# Patient Record
Sex: Female | Born: 1985 | Race: White | Hispanic: No | Marital: Married | Smoking: Never smoker
Health system: Southern US, Community
[De-identification: ages and names within clinical notes are randomized; demographics above are authoritative.]

## PROBLEM LIST (undated history)

## (undated) DIAGNOSIS — K219 Gastro-esophageal reflux disease without esophagitis: Secondary | ICD-10-CM

## (undated) DIAGNOSIS — K5190 Ulcerative colitis, unspecified, without complications: Secondary | ICD-10-CM

## (undated) DIAGNOSIS — F419 Anxiety disorder, unspecified: Secondary | ICD-10-CM

## (undated) DIAGNOSIS — Z789 Other specified health status: Secondary | ICD-10-CM

## (undated) HISTORY — PX: WISDOM TOOTH EXTRACTION: SHX21

## (undated) HISTORY — PX: COLONOSCOPY: SHX174

---

## 2016-06-25 DIAGNOSIS — Z124 Encounter for screening for malignant neoplasm of cervix: Secondary | ICD-10-CM | POA: Diagnosis not present

## 2016-06-25 DIAGNOSIS — Z01419 Encounter for gynecological examination (general) (routine) without abnormal findings: Secondary | ICD-10-CM | POA: Diagnosis not present

## 2016-12-15 DIAGNOSIS — N7689 Other specified inflammation of vagina and vulva: Secondary | ICD-10-CM | POA: Diagnosis not present

## 2017-02-18 DIAGNOSIS — N930 Postcoital and contact bleeding: Secondary | ICD-10-CM | POA: Diagnosis not present

## 2017-03-25 NOTE — L&D Delivery Note (Signed)
Patient was C/C/+2 and pushed for 30 minutes with epidural.    NSVD female infant, Apgars 9/9, weight pending.  Shoulder dystocia relieved in approx 10sec with McRoberts and suprapubic pressure. The patient had right periurethral, right vaginal and 2nd degree laceration repaired with 2-0 and 3-0 vicryl. Fundus was firm. EBL was expected amount. Placenta was delivered intact. Vagina was clear.  Delayed cord clamping done for 30-60 seconds while warming baby. Baby was vigorous and doing skin to skin with mother.  Cynthia Campbell

## 2017-04-14 DIAGNOSIS — Z3201 Encounter for pregnancy test, result positive: Secondary | ICD-10-CM | POA: Diagnosis not present

## 2017-04-14 DIAGNOSIS — Z113 Encounter for screening for infections with a predominantly sexual mode of transmission: Secondary | ICD-10-CM | POA: Diagnosis not present

## 2017-04-14 DIAGNOSIS — Z3491 Encounter for supervision of normal pregnancy, unspecified, first trimester: Secondary | ICD-10-CM | POA: Diagnosis not present

## 2017-04-14 DIAGNOSIS — N925 Other specified irregular menstruation: Secondary | ICD-10-CM | POA: Diagnosis not present

## 2017-05-12 DIAGNOSIS — Z3481 Encounter for supervision of other normal pregnancy, first trimester: Secondary | ICD-10-CM | POA: Diagnosis not present

## 2017-06-10 DIAGNOSIS — Z3481 Encounter for supervision of other normal pregnancy, first trimester: Secondary | ICD-10-CM | POA: Diagnosis not present

## 2017-07-08 DIAGNOSIS — Z363 Encounter for antenatal screening for malformations: Secondary | ICD-10-CM | POA: Diagnosis not present

## 2017-09-08 DIAGNOSIS — O360990 Maternal care for other rhesus isoimmunization, unspecified trimester, not applicable or unspecified: Secondary | ICD-10-CM | POA: Diagnosis not present

## 2017-09-08 DIAGNOSIS — Z3480 Encounter for supervision of other normal pregnancy, unspecified trimester: Secondary | ICD-10-CM | POA: Diagnosis not present

## 2017-09-08 DIAGNOSIS — Z23 Encounter for immunization: Secondary | ICD-10-CM | POA: Diagnosis not present

## 2017-10-02 DIAGNOSIS — Z369 Encounter for antenatal screening, unspecified: Secondary | ICD-10-CM | POA: Diagnosis not present

## 2017-10-16 DIAGNOSIS — Z369 Encounter for antenatal screening, unspecified: Secondary | ICD-10-CM | POA: Diagnosis not present

## 2017-10-29 DIAGNOSIS — Z369 Encounter for antenatal screening, unspecified: Secondary | ICD-10-CM | POA: Diagnosis not present

## 2017-10-29 DIAGNOSIS — Z3480 Encounter for supervision of other normal pregnancy, unspecified trimester: Secondary | ICD-10-CM | POA: Diagnosis not present

## 2017-11-05 DIAGNOSIS — Z369 Encounter for antenatal screening, unspecified: Secondary | ICD-10-CM | POA: Diagnosis not present

## 2017-11-14 DIAGNOSIS — Z369 Encounter for antenatal screening, unspecified: Secondary | ICD-10-CM | POA: Diagnosis not present

## 2017-11-21 DIAGNOSIS — Z369 Encounter for antenatal screening, unspecified: Secondary | ICD-10-CM | POA: Diagnosis not present

## 2017-11-23 ENCOUNTER — Encounter (HOSPITAL_COMMUNITY): Payer: Self-pay | Admitting: *Deleted

## 2017-11-23 ENCOUNTER — Other Ambulatory Visit: Payer: Self-pay

## 2017-11-23 ENCOUNTER — Inpatient Hospital Stay (HOSPITAL_COMMUNITY)
Admission: AD | Admit: 2017-11-23 | Discharge: 2017-11-25 | DRG: 807 | Disposition: A | Discharge status: Home / Self Care | Payer: 59 | Attending: Obstetrics and Gynecology | Admitting: Obstetrics and Gynecology

## 2017-11-23 ENCOUNTER — Inpatient Hospital Stay (HOSPITAL_COMMUNITY): Payer: 59 | Admitting: Anesthesiology

## 2017-11-23 DIAGNOSIS — Z3A39 39 weeks gestation of pregnancy: Secondary | ICD-10-CM | POA: Diagnosis not present

## 2017-11-23 DIAGNOSIS — Z3483 Encounter for supervision of other normal pregnancy, third trimester: Secondary | ICD-10-CM | POA: Diagnosis present

## 2017-11-23 HISTORY — DX: Other specified health status: Z78.9

## 2017-11-23 LAB — CBC
HCT: 36.2 % (ref 36.0–46.0)
Hemoglobin: 12.5 g/dL (ref 12.0–15.0)
MCH: 31.8 pg (ref 26.0–34.0)
MCHC: 34.5 g/dL (ref 30.0–36.0)
MCV: 92.1 fL (ref 78.0–100.0)
PLATELETS: 213 10*3/uL (ref 150–400)
RBC: 3.93 MIL/uL (ref 3.87–5.11)
RDW: 14.9 % (ref 11.5–15.5)
WBC: 12 10*3/uL — ABNORMAL HIGH (ref 4.0–10.5)

## 2017-11-23 LAB — POCT FERN TEST: POCT Fern Test: POSITIVE

## 2017-11-23 LAB — RPR: RPR Ser Ql: NONREACTIVE

## 2017-11-23 MED ORDER — TETANUS-DIPHTH-ACELL PERTUSSIS 5-2.5-18.5 LF-MCG/0.5 IM SUSP: 0.5000 mL | Freq: Once | INTRAMUSCULAR | Status: DC

## 2017-11-23 MED ORDER — COCONUT OIL OIL: 1.0000 "application " | TOPICAL_OIL | Status: DC | PRN

## 2017-11-23 MED ORDER — PRENATAL MULTIVITAMIN CH
1.0000 | ORAL_TABLET | Freq: Every day | ORAL | Status: DC
  Administered 2017-11-23 – 2017-11-24 (×2): 1 via ORAL
  Filled 2017-11-23 (×2): qty 1

## 2017-11-23 MED ORDER — OXYTOCIN BOLUS FROM INFUSION
500.0000 mL | Freq: Once | INTRAVENOUS | Status: AC
  Administered 2017-11-23: 500 mL via INTRAVENOUS

## 2017-11-23 MED ORDER — SENNOSIDES-DOCUSATE SODIUM 8.6-50 MG PO TABS
2.0000 | ORAL_TABLET | ORAL | Status: DC
  Administered 2017-11-23 – 2017-11-24 (×2): 2 via ORAL
  Filled 2017-11-23: qty 2

## 2017-11-23 MED ORDER — LIDOCAINE-EPINEPHRINE (PF) 2 %-1:200000 IJ SOLN
INTRAMUSCULAR | Status: DC | PRN
  Administered 2017-11-23 (×2): 3 mL via EPIDURAL

## 2017-11-23 MED ORDER — SIMETHICONE 80 MG PO CHEW: 80.0000 mg | CHEWABLE_TABLET | ORAL | Status: DC | PRN

## 2017-11-23 MED ORDER — PHENYLEPHRINE 40 MCG/ML (10ML) SYRINGE FOR IV PUSH (FOR BLOOD PRESSURE SUPPORT)
80.0000 ug | PREFILLED_SYRINGE | INTRAVENOUS | Status: DC | PRN
  Administered 2017-11-23: 80 ug via INTRAVENOUS
  Filled 2017-11-23: qty 5

## 2017-11-23 MED ORDER — LACTATED RINGERS IV SOLN
500.0000 mL | INTRAVENOUS | Status: DC | PRN
  Administered 2017-11-23: 500 mL via INTRAVENOUS

## 2017-11-23 MED ORDER — PHENYLEPHRINE 40 MCG/ML (10ML) SYRINGE FOR IV PUSH (FOR BLOOD PRESSURE SUPPORT)
PREFILLED_SYRINGE | INTRAVENOUS | Status: AC
  Filled 2017-11-23: qty 20

## 2017-11-23 MED ORDER — WITCH HAZEL-GLYCERIN EX PADS
1.0000 "application " | MEDICATED_PAD | CUTANEOUS | Status: DC | PRN
  Administered 2017-11-23: 1 via TOPICAL

## 2017-11-23 MED ORDER — OXYCODONE-ACETAMINOPHEN 5-325 MG PO TABS: 2.0000 | ORAL_TABLET | ORAL | Status: DC | PRN

## 2017-11-23 MED ORDER — ONDANSETRON HCL 4 MG PO TABS: 4.0000 mg | ORAL_TABLET | ORAL | Status: DC | PRN

## 2017-11-23 MED ORDER — LIDOCAINE HCL (PF) 1 % IJ SOLN
30.0000 mL | INTRAMUSCULAR | Status: DC | PRN
  Filled 2017-11-23: qty 30

## 2017-11-23 MED ORDER — TERBUTALINE SULFATE 1 MG/ML IJ SOLN
0.2500 mg | Freq: Once | INTRAMUSCULAR | Status: DC | PRN
  Filled 2017-11-23: qty 1

## 2017-11-23 MED ORDER — OXYTOCIN 40 UNITS IN LACTATED RINGERS INFUSION - SIMPLE MED
1.0000 m[IU]/min | INTRAVENOUS | Status: DC
  Administered 2017-11-23: 2 m[IU]/min via INTRAVENOUS

## 2017-11-23 MED ORDER — OXYCODONE-ACETAMINOPHEN 5-325 MG PO TABS: 1.0000 | ORAL_TABLET | ORAL | Status: DC | PRN

## 2017-11-23 MED ORDER — ZOLPIDEM TARTRATE 5 MG PO TABS: 5.0000 mg | ORAL_TABLET | Freq: Every evening | ORAL | Status: DC | PRN

## 2017-11-23 MED ORDER — FENTANYL CITRATE (PF) 100 MCG/2ML IJ SOLN: 50.0000 ug | INTRAMUSCULAR | Status: DC | PRN

## 2017-11-23 MED ORDER — EPHEDRINE 5 MG/ML INJ
10.0000 mg | INTRAVENOUS | Status: DC | PRN
  Filled 2017-11-23: qty 2

## 2017-11-23 MED ORDER — ACETAMINOPHEN 325 MG PO TABS: 650.0000 mg | ORAL_TABLET | ORAL | Status: DC | PRN

## 2017-11-23 MED ORDER — BUPIVACAINE HCL (PF) 0.25 % IJ SOLN
INTRAMUSCULAR | Status: DC | PRN
  Administered 2017-11-23: 10 mL via EPIDURAL

## 2017-11-23 MED ORDER — ONDANSETRON HCL 4 MG/2ML IJ SOLN: 4.0000 mg | INTRAMUSCULAR | Status: DC | PRN

## 2017-11-23 MED ORDER — FENTANYL 2.5 MCG/ML BUPIVACAINE 1/10 % EPIDURAL INFUSION (WH - ANES)
14.0000 mL/h | INTRAMUSCULAR | Status: DC | PRN
  Administered 2017-11-23: 14 mL/h via EPIDURAL

## 2017-11-23 MED ORDER — BENZOCAINE-MENTHOL 20-0.5 % EX AERO
1.0000 "application " | INHALATION_SPRAY | CUTANEOUS | Status: DC | PRN
  Administered 2017-11-23 – 2017-11-25 (×2): 1 via TOPICAL
  Filled 2017-11-23 (×3): qty 56

## 2017-11-23 MED ORDER — SOD CITRATE-CITRIC ACID 500-334 MG/5ML PO SOLN: 30.0000 mL | ORAL | Status: DC | PRN

## 2017-11-23 MED ORDER — DIPHENHYDRAMINE HCL 50 MG/ML IJ SOLN: 12.5000 mg | INTRAMUSCULAR | Status: DC | PRN

## 2017-11-23 MED ORDER — ACETAMINOPHEN 325 MG PO TABS
650.0000 mg | ORAL_TABLET | ORAL | Status: DC | PRN
  Administered 2017-11-23 – 2017-11-24 (×6): 650 mg via ORAL
  Filled 2017-11-23 (×6): qty 2

## 2017-11-23 MED ORDER — OXYTOCIN 40 UNITS IN LACTATED RINGERS INFUSION - SIMPLE MED
2.5000 [IU]/h | INTRAVENOUS | Status: DC
  Filled 2017-11-23: qty 1000

## 2017-11-23 MED ORDER — LACTATED RINGERS IV SOLN
500.0000 mL | Freq: Once | INTRAVENOUS | Status: AC
  Administered 2017-11-23: 500 mL via INTRAVENOUS

## 2017-11-23 MED ORDER — FLEET ENEMA 7-19 GM/118ML RE ENEM: 1.0000 | ENEMA | RECTAL | Status: DC | PRN

## 2017-11-23 MED ORDER — DIBUCAINE 1 % RE OINT
1.0000 "application " | TOPICAL_OINTMENT | RECTAL | Status: DC | PRN
  Administered 2017-11-23 – 2017-11-25 (×2): 1 via RECTAL
  Filled 2017-11-23 (×2): qty 28

## 2017-11-23 MED ORDER — FENTANYL 2.5 MCG/ML BUPIVACAINE 1/10 % EPIDURAL INFUSION (WH - ANES)
INTRAMUSCULAR | Status: AC
  Filled 2017-11-23: qty 100

## 2017-11-23 MED ORDER — DIPHENHYDRAMINE HCL 25 MG PO CAPS: 25.0000 mg | ORAL_CAPSULE | Freq: Four times a day (QID) | ORAL | Status: DC | PRN

## 2017-11-23 MED ORDER — ONDANSETRON HCL 4 MG/2ML IJ SOLN: 4.0000 mg | Freq: Four times a day (QID) | INTRAMUSCULAR | Status: DC | PRN

## 2017-11-23 MED ORDER — LACTATED RINGERS IV SOLN
INTRAVENOUS | Status: DC
  Administered 2017-11-23 (×3): via INTRAVENOUS

## 2017-11-23 MED ORDER — IBUPROFEN 600 MG PO TABS
600.0000 mg | ORAL_TABLET | Freq: Four times a day (QID) | ORAL | Status: DC
  Administered 2017-11-23 – 2017-11-25 (×7): 600 mg via ORAL
  Filled 2017-11-23 (×8): qty 1

## 2017-11-23 MED ORDER — OXYCODONE-ACETAMINOPHEN 5-325 MG PO TABS
1.0000 | ORAL_TABLET | ORAL | Status: DC | PRN
  Administered 2017-11-24 – 2017-11-25 (×2): 1 via ORAL
  Filled 2017-11-23 (×2): qty 1

## 2017-11-23 MED ORDER — PHENYLEPHRINE 40 MCG/ML (10ML) SYRINGE FOR IV PUSH (FOR BLOOD PRESSURE SUPPORT)
80.0000 ug | PREFILLED_SYRINGE | INTRAVENOUS | Status: DC | PRN
  Filled 2017-11-23: qty 5

## 2017-11-23 NOTE — H&P (Signed)
32 y.o. [redacted]w[redacted]d  G1P0 comes in c/o rupture of membranes at 11pm.  Otherwise has good fetal movement and no bleeding.  Past Medical History:  Diagnosis Date  . Medical history non-contributory     Past Surgical History:  Procedure Laterality Date  . WISDOM TOOTH EXTRACTION      OB History  Gravida Para Term Preterm AB Living  1            SAB TAB Ectopic Multiple Live Births               # Outcome Date GA Lbr Len/2nd Weight Sex Delivery Anes PTL Lv  1 Current             Social History   Socioeconomic History  . Marital status: Married    Spouse name: Not on file  . Number of children: Not on file  . Years of education: Not on file  . Highest education level: Not on file  Occupational History  . Not on file  Social Needs  . Financial resource strain: Not on file  . Food insecurity:    Worry: Not on file    Inability: Not on file  . Transportation needs:    Medical: Not on file    Non-medical: Not on file  Tobacco Use  . Smoking status: Never Smoker  . Smokeless tobacco: Never Used  Substance and Sexual Activity  . Alcohol use: Not Currently  . Drug use: Never  . Sexual activity: Yes  Lifestyle  . Physical activity:    Days per week: Not on file    Minutes per session: Not on file  . Stress: Not on file  Relationships  . Social connections:    Talks on phone: Not on file    Gets together: Not on file    Attends religious service: Not on file    Active member of club or organization: Not on file    Attends meetings of clubs or organizations: Not on file    Relationship status: Not on file  . Intimate partner violence:    Fear of current or ex partner: Not on file    Emotionally abused: Not on file    Physically abused: Not on file    Forced sexual activity: Not on file  Other Topics Concern  . Not on file  Social History Narrative  . Not on file   Patient has no known allergies.    Prenatal Transfer Tool  Maternal Diabetes: No Genetic Screening:  Normal Maternal Ultrasounds/Referrals: Normal Fetal Ultrasounds or other Referrals:  None Maternal Substance Abuse:  No Significant Maternal Medications:  None Significant Maternal Lab Results: Lab values include: Group B Strep negative  Other PNC: uncomplicated.    Vitals:   11/23/17 0630 11/23/17 0700 11/23/17 0724 11/23/17 0754  BP: 113/60 110/61    Pulse: 70 69    Resp: 18 18 18 20   Temp:   98.3 F (36.8 C)   TempSrc:   Oral   Weight:      Height:        Lungs/Cor:  NAD Abdomen:  soft, gravid Ex:  no cords, erythema SVE:  5/100/1 FHTs:  120, good STV, NST R Toco:  q2-3   A/P   Admitted with SROM  GBS Neg  Called at 4 am with info that pt was complete.  I came to assess pt and found her to be 5cm.  Pitocin started as contractions were q4  Other routine care  Allyn Kenner

## 2017-11-23 NOTE — Anesthesia Preprocedure Evaluation (Signed)

## 2017-11-23 NOTE — Anesthesia Procedure Notes (Signed)
Epidural Patient location during procedure: OB Start time: 11/23/2017 3:50 AM End time: 11/23/2017 4:10 AM  Staffing Anesthesiologist: Freddrick March, MD Performed: anesthesiologist   Preanesthetic Checklist Completed: patient identified, pre-op evaluation, timeout performed, IV checked, risks and benefits discussed and monitors and equipment checked  Epidural Patient position: sitting Prep: site prepped and draped and DuraPrep Patient monitoring: continuous pulse ox, blood pressure, heart rate and cardiac monitor Approach: midline Location: L3-L4 Injection technique: LOR air  Needle:  Needle type: Tuohy  Needle gauge: 17 G Needle length: 9 cm Needle insertion depth: 5.5 cm Catheter type: closed end flexible Catheter size: 19 Gauge Catheter at skin depth: 11 cm Test dose: negative  Assessment Sensory level: T8 Events: blood not aspirated, injection not painful, no injection resistance, negative IV test and no paresthesia  Additional Notes Patient identified. Risks/Benefits/Options discussed with patient including but not limited to bleeding, infection, nerve damage, paralysis, failed block, incomplete pain control, headache, blood pressure changes, nausea, vomiting, reactions to medication both or allergic, itching and postpartum back pain. Confirmed with bedside nurse the patient's most recent platelet count. Confirmed with patient that they are not currently taking any anticoagulation, have any bleeding history or any family history of bleeding disorders. Patient expressed understanding and wished to proceed. All questions were answered. Sterile technique was used throughout the entire procedure. Please see nursing notes for vital signs. Test dose was given through epidural catheter and negative prior to continuing to dose epidural or start infusion. Warning signs of high block given to the patient including shortness of breath, tingling/numbness in hands, complete motor block,  or any concerning symptoms with instructions to call for help. Patient was given instructions on fall risk and not to get out of bed. All questions and concerns addressed with instructions to call with any issues or inadequate analgesia.  Reason for block:procedure for pain

## 2017-11-23 NOTE — MAU Note (Addendum)
Pt reports she had a gush of fluid at 1130pm. Good fetal movement mild ctx reported as well.

## 2017-11-23 NOTE — Progress Notes (Signed)
Dr. Rogue Bussing called about vaginal exam. Orders to labor down until forebag ruptures, practice push with patient, and call her back to re-evaluate.

## 2017-11-23 NOTE — Anesthesia Postprocedure Evaluation (Signed)
Anesthesia Post Note  Patient: Cynthia Campbell  Procedure(s) Performed: AN AD HOC LABOR EPIDURAL     Patient location during evaluation: Mother Baby Anesthesia Type: Epidural Level of consciousness: awake and alert and oriented Pain management: satisfactory to patient Vital Signs Assessment: post-procedure vital signs reviewed and stable Respiratory status: spontaneous breathing and nonlabored ventilation Cardiovascular status: stable Postop Assessment: no headache, no backache, no signs of nausea or vomiting, adequate PO intake and patient able to bend at knees (patient up walking) Anesthetic complications: no    Last Vitals:  Vitals:   11/23/17 1145 11/23/17 1300  BP: 128/72 130/70  Pulse: 66 71  Resp: 20 20  Temp: 36.6 C 36.9 C  SpO2: 100% 100%    Last Pain:  Vitals:   11/23/17 1300  TempSrc: Oral  PainSc:    Pain Goal:                 Hideko Esselman

## 2017-11-24 LAB — CBC
HCT: 32.4 % — ABNORMAL LOW (ref 36.0–46.0)
Hemoglobin: 11.2 g/dL — ABNORMAL LOW (ref 12.0–15.0)
MCH: 31.9 pg (ref 26.0–34.0)
MCHC: 34.6 g/dL (ref 30.0–36.0)
MCV: 92.3 fL (ref 78.0–100.0)
PLATELETS: 177 10*3/uL (ref 150–400)
RBC: 3.51 MIL/uL — ABNORMAL LOW (ref 3.87–5.11)
RDW: 15.1 % (ref 11.5–15.5)
WBC: 10.4 10*3/uL (ref 4.0–10.5)

## 2017-11-24 MED ORDER — RHO D IMMUNE GLOBULIN 1500 UNIT/2ML IJ SOSY
300.0000 ug | PREFILLED_SYRINGE | Freq: Once | INTRAMUSCULAR | Status: AC
  Administered 2017-11-24: 300 ug via INTRAVENOUS
  Filled 2017-11-24: qty 2

## 2017-11-24 NOTE — Progress Notes (Signed)
PPD#1 Pt without complaints. Lochia wnl VSSAF IMP/ Stable Plan/ Routine care

## 2017-11-24 NOTE — Lactation Note (Signed)
This note was copied from a baby's chart. Lactation Consultation Note  Patient Name: Cynthia Campbell SKSHN'G Date: 11/24/2017 Reason for consult: Initial assessment   P1, Baby 43 hours old and recently circumcised. Baby has been doing well with some long feeds.  Stools/Voids WNL. Nipples evert and compressible.  Reviewed hand expression and how to use hand pump. Spoon fed baby 3 ml of colostrum to interest him in feeding and unwrapped baby. Attempted breastfeeding in both football and cross cradle hold. Baby latched briefly and fell back asleep. Suggest allowing baby to rest for another hour and then place him STS. Mom encouraged to feed baby 8-12 times/24 hours and with feeding cues.  Mom made aware of O/P services, breastfeeding support groups, community resources, and our phone # for post-discharge questions.     Maternal Data Has patient been taught Hand Expression?: Yes Does the patient have breastfeeding experience prior to this delivery?: No  Feeding Feeding Type: Breast Fed Length of feed: 60 min  LATCH Score                   Interventions Interventions: Breast feeding basics reviewed;Assisted with latch;Skin to skin;Breast massage;Hand express;Pre-pump if needed;Breast compression;Adjust position;Position options;Support pillows;Expressed milk;Hand pump  Lactation Tools Discussed/Used     Consult Status Consult Status: Follow-up Date: 11/25/17 Follow-up type: In-patient    Vivianne Master [facility] 11/24/2017, 12:14 PM

## 2017-11-25 LAB — RH IG WORKUP (INCLUDES ABO/RH)
ABO/RH(D): O NEG
Fetal Screen: NEGATIVE
GESTATIONAL AGE(WKS): 39.1
Unit division: 0

## 2017-11-25 NOTE — Discharge Summary (Signed)
Obstetric Discharge Summary Reason for Admission: rupture of membranes Prenatal Procedures: none Intrapartum Procedures: spontaneous vaginal delivery Postpartum Procedures: Rho(D) Ig Complications-Operative and Postpartum: 2 degree perineal laceration, vaginal and periurethral Hemoglobin  Date Value Ref Range Status  11/24/2017 11.2 (L) 12.0 - 15.0 g/dL Final   HCT  Date Value Ref Range Status  11/24/2017 32.4 (L) 36.0 - 46.0 % Final    Discharge Diagnoses: Term Pregnancy-delivered  Discharge Information: Date: 11/25/2017 Activity: pelvic rest Diet: routine Medications: Ibuprofen Condition: stable Instructions: refer to practice specific booklet Discharge to: home Follow-up Information    Allyn Kenner, DO Follow up in 4 week(s).   Specialty:  Obstetrics and Gynecology Contact information: [address] [place] [address] [phone]           Newborn Data: Live born female  Birth Weight: 8 lb 10.5 oz (3925 g) APGAR: 13, 9  Newborn Delivery   Birth date/time:  11/23/2017 09:20:00 Delivery type:  Vaginal, Spontaneous     Home with mother.  Dayle Mcnerney A 11/25/2017, 6:46 AM

## 2017-11-25 NOTE — Lactation Note (Signed)
This note was copied from a baby's chart. Lactation Consultation Note  Patient Name: Boy Nafeesah Lapaglia ALPFX'T Date: 11/25/2017 Reason for consult: Follow-up assessment  Visited with P37 Mom of term baby at 33 hrs old. Output good, no weight today yet.  Mom had baby in football hold, with baby pointed away from breast.  Baby taken off breast, and nipple pinched significantly.  Mom complaining of pain with latch.    Hand expression reviewed, colostrum easily expressed.  Assisted in better positioning using more support.  Baby able to latch much deeper onto breast.  Mom stated she didn't feel any discomfort.  Swallows identified for parents.  Taught Mom to use alternate breast compression to increase milk transfer.  Taught FOB how to gently pull on chin to open mouth wider if latch is shallow.   Encouraged keeping baby STS as much as possible.  Goal is >8 feedings per 24 hrs.  Discussed how latch depth is most important.  Engorgement prevention and treatment discussed. Mom aware of OP lactation services available to her and encouraged to call prn for assistance.  Mom [employer] employee, [employer] DEBP given.   Interventions Interventions: Breast feeding basics reviewed;Assisted with latch;Skin to skin;Breast massage;Hand express;Breast compression;Adjust position;Support pillows;Position options;Expressed milk;Coconut oil  Lactation Tools Discussed/Used Tools: Coconut oil   Consult Status Consult Status: Complete Date: 11/25/17 Follow-up type: Call as needed    Broadus John 11/25/2017, 9:39 AM

## 2017-11-25 NOTE — Progress Notes (Signed)
Patient is eating, ambulating, voiding.  Pain control is good.  Vitals:   11/24/17 0541 11/24/17 1458 11/24/17 2251 11/25/17 0632  BP: (!) 95/54 133/74 (!) 121/58 118/74  Pulse: 61 62 70 (!) 59  Resp: 18 17  18   Temp: 97.9 F (36.6 C) 98.1 F (36.7 C) 97.9 F (36.6 C) 97.9 F (36.6 C)  TempSrc: Oral  Oral Oral  SpO2:  100%    Weight:      Height:        Fundus firm Perineum without swelling.  Lab Results  Component Value Date   WBC 10.4 11/24/2017   HGB 11.2 (L) 11/24/2017   HCT 32.4 (L) 11/24/2017   MCV 92.3 11/24/2017   PLT 177 11/24/2017    --/--/O NEG (09/02 0533)/RI  A/P Post partum day 2.  Routine care.  Expect d/c today.    Kemesha Mosey A

## 2017-11-27 ENCOUNTER — Ambulatory Visit: Payer: Self-pay

## 2017-11-27 LAB — TYPE AND SCREEN
ABO/RH(D): O NEG
Antibody Screen: POSITIVE
UNIT DIVISION: 0
UNIT DIVISION: 0

## 2017-11-27 LAB — BPAM RBC
BLOOD PRODUCT EXPIRATION DATE: 201910052359
Blood Product Expiration Date: 201910052359
ISSUE DATE / TIME: 201909021358
UNIT TYPE AND RH: 9500
UNIT TYPE AND RH: 9500

## 2017-11-27 NOTE — Lactation Note (Signed)
This note was copied from a baby's chart.  11/27/2017  Name: Cynthia Campbell MRN: [id number] Date of Birth: 11/23/2015 Gestational Age: Gestational Age: [redacted]w[redacted]d Birth Weight: 138.5 oz Weight today:    8 pounds 2.2 ounces (3690 grams) with clean newborn diaper   Infant presents today with mom for feeding assessment. Mom is having difficulty latching infant due to pain. Infant lost more weight after discharge home and they supplemented with formula and EBM since yesterday.   Infant has gained 88 grams in the last 2 days with an average daily weight gain of 44 grams a day.   Infant is eating every 1-2 hours. Mom has not been latching him to the breast in the last day due to pain on her nipples and wanting to pump and supplement him due to weight loss.    Mom's milk is in and she is able to pump 1.5 ounces every 2 hours with Free Style pump. Infant is being supplemented with the Medela nipple and tolerating feeding well. Mom reports tome drooling on the bottle.   Mom with scabs to nipples. She is currently using Coconut oil. Gave her comfort gels with instructions for use and cleaning. Nipple shield given, showed mom how to apply and use. Discussed importance of using as needed and weaning off as able. Showed her how to clean.   Infant latched to the right breast. He was noted to have a narrow gape and upper and lower lips curled in. Infant with tight jaw muscles and had difficulty getting him flanged well, mom report she has difficulty as well. Infant fed for about 15 minutes, he was very sleepy. He transferred 36 ml. Infant was then latched to the left breast in the cross cradle hold with the # 24 NS, infant latched easily, upper and lower jaw needed flanging. Infant nursed for a short period before becoming tired. Infant satisfied and quietly alert post feeding.   Infant with thick labial frenulum with very tight upper lip. Sucking blister noted. Upper lip blanches with flanging. Upper lip with red  ring around mouth after BF. Infant noted to have short thick posterior lingual frenulum noted with crying. He is able to extend tongue well and lateralize tongue well. He has some limitations to mid tongue elevation. Infant with strong suckle and good tongue extension when sucking on gloved finger, he is noted to have hump to back of tongue with suckling. Mom in a lot of pain with feedings, she reports her nipple is usually flat after BF. She reports pain throughout feeding. Infant with slightly recessed chin. Mom was shown structures and discussed how they can effect transfer, pain in mom, and milk production. Mom given website information and local providers.   Infant to follow with Griffin Dakin, NP on Sept 12. [facility] to come out to visit on 9/9. Infant to follow up with Lactation in 1 week. Mom aware of BF Support Groups.   Mom reports all questions have been answered. Mom to call with any questions/concerns as needed.     General Information: Mother's reason for visit: Feeding assessment Consult: Initial Lactation consultant: Cynthia Mattes RN,IBCLC Breastfeeding experience: Painful nipples, difficulty latching, supplementing due to weight loss   Maternal medications: Pre-natal vitamin, Other(Fish Oil)  Breastfeeding History: Frequency of breast feeding: every 1-2 hours, stopped latching yesterday afternoon Duration of feeding: 20 minutes, mostly on the right breast  Supplementation: Supplement method: bottle(Medela) Brand: Similac Formula volume: 1 ounce Formula frequency: x 8 yesterday Total formula  volume per day: 8 ounces Breast milk volume: 1 ounce Breast milk frequency: every 2-3 hours   Pump type: Other(Medela Free Style) Pump frequency: every 2 hours Pump volume: 1.5 ounces as of last night  Infant Output Assessment: Voids per 24 hours: 6 Urine color: Clear yellow Stools per 24 hours: 2 Stool color: Brown  Breast Assessment: Breast: Filling,  Compressible Nipple: Erect, Scabs, Cracked, Reddened Pain level: 10(with latch, better with pumping) Pain interventions: Bra, Coconut oil  Feeding Assessment: Infant oral assessment: Variance Infant oral assessment comment: Infant with thick labial frenulum with very tight upper lip. Sucking blister noted. Upper lip blanches with flanging. Upper lip with red ring around mouth after BF. Infant noted to have short thick posterior lingual frenulum noted with crying. He is able to extend tongue well and lateralize tongue well. He has some limitations to mid tongue elevation. Infant with strong suckle and good tongue extension when sucking on gloved finger, he is noted to have hump to back of tongue with suckling. Mom in a lot of pain with feedings, she reports her nipple is usually flat after BF. She reports pain throughout feeding.  Positioning: Cross cradle(right breast, 15 minutes) Latch: 1 - Repeated attempts needed to sustain latch, nipple held in mouth throughout feeding, stimulation needed to elicit sucking reflex. Audible swallowing: 2 - Spontaneous and intermittent Type of nipple: 2 - Everted at rest and after stimulation Comfort: 1 - Filling, red/small blisters or bruises, mild/mod discomfort Hold: 1 - Assistance needed to correctly position infant at breast and maintain latch LATCH score: 7 Latch assessment: Deep Lips flanged: No(upper lip and lower lip needed flanging) Suck assessment: Displays both   Pre-feed weight: 3690 grams Post feed weight: 3726 grams Amount transferred: 36 ml    Additional Feeding Assessment: Infant oral assessment: Variance Infant oral assessment comment: see above Positioning: Cross cradle(left breast, 15 minutes) Latch: 1 - Repeated attempts neede to sustain latch, nipple held in mouth throughout feeding, stimulation needed to elicit sucking reflex. Audible swallowing: 1 - A few with stimulation Type of nipple: 2 - Everted at rest and after  stimulation Comfort: 1 - Filling, red/small blisters or bruises, mild/mod discomfort Hold: 1 - Assistance needed to correctly position infant at breast and maintain latch LATCH score: 6 Latch assessment: Deep Lips flanged: No(upper lip and lower lip needed flanging) Suck assessment: Displays both Tools: Nipple shield 24 mm Pre-feed weight: 3726 grams Post feed weight: 3734 grams Amount transferred: 8 ml Amount supplemented: 0  Totals: Total amount transferred: 44 ml Total supplement given: 0 Total amount pumped post feed: did not pump   Plan:  1. Offer breast with feeding cues as mom and infant desire, Limit breast feeding to 20-30 minutes if he is not actively nursing 2. Use the # 24 Nipple Shield with feedings as needed for pain 3. Keep infant awake at the breast as needed , feed infant skin to skin 4. Massage/compress breast with feeding 5. Offer infant bottle of pumped breast milk after breast feeding, especially if he is cueing to feed 6. Continue to use the Medela nipple for feeding 7. Use the paced bottle feeding method to supplement infant (video on [email or web address]) 8. Infant needs about 68-90 ml (2-3 ounces) for 8 feedings a day or 540-720 ml (18-24 ounces) in 24 hours. Infant may take more or less per feeding depending on how often he feeds 9 . Continue pumping 8 times a day after breast feeding for 15-20 minutes to protect milk supply.  Use your double electric breast pump 10. Consider having infant evaluated by an Oral Specialist especially if we do not see improvement in his feeding 11. Keep up the good work 76. Thank you for allowing me to assist you today 13. Please call with any questions/concerns as needed [phone] 14. Follow up with Lactation in 1 week   [place], IBCLC                                                        Debby Freiberg Montae Stager 11/27/2017, 2:18 PM

## 2017-12-03 ENCOUNTER — Inpatient Hospital Stay (HOSPITAL_COMMUNITY): Admit: 2017-12-03 | Payer: 59

## 2017-12-05 ENCOUNTER — Ambulatory Visit: Payer: Self-pay

## 2017-12-05 NOTE — Lactation Note (Signed)
This note was copied from a baby's chart. 12/05/2017  Name: Cynthia Campbell MRN: [id number] Date of Birth: 11/23/2015 Gestational Age: Gestational Age: [redacted]w[redacted]d Birth Weight: 138.5 oz Weight today:    8 pounds 14.5 ounces (4038 grams) with clean size 1 diaper  Infant presents today with mom for follow up feeding assessment. Infant has been nursing better in the last week. Infant awake and alert and cueing to feed when he arrived.   Infant has gained 348 grams in the last 8 days with an average daily weight gain of 44 grams a day. Infant is now over his birthweight.   Mom reports infant is BF on both breasts for about 15 minutes each. Infant is taking about an ounce of formula after most BF. Mom reports infant is sometimes sleepy at the breast. Infant will not feed with the NS. Mom reports her nipples have healed. Mom reports nipples are less flat after feeding. Mom notices breast softening with feeding.   Mom is pumping about 6 x a day and getting about 1/2-1 ounce per pumping.   Infant with thick labial frenulum that inserts at the bottom of the gum ridge, upper lip is tight with flanging. Infant with strong suckle on gloved finger with good tongue extension and lateralization. Infant with posterior lingual frenulum with good tongue lateralization and extension. Infant with limited mid tongue elevation. Mom would like to see how things go before seeing oral specialist. Discussed with mom that if infant not improving, may still need to see Oral Specialist.   Mom has purchased Mother's milk Tea and is drinking that currently. She has obtained blessed Thistle and ordered Fenugreek to start taking as infant is needing about 8 ounces of formula a day. Fenugreek dosage for milk production discussed and handout given. Mom has purchased a Psychologist, forensic at recommendation of Armed forces technical officer. Enc mom to make sure infant gets the milk that is obtained in the [facility].   Infant fed on both breasts for feeding.  Infant transferred 48 ml from the left breast, nipple was slightly compressed post feeding. Mom reports pain with initial latch that improves with feeding. Infant then fed on the right breast and transferred 32 ml. Infant satisfied post feeding.   Infant to follow up with Griffin Dakin, PNP 9/16. Family Connects have been out and is not planning to come out again. Mom aware of BF Support Groups, she plans to attend this week. Infant to follow up with Lactation as needed.     General Information: Mother's reason for visit: Follow up feeding assessment Consult: Follow-up Lactation consultant: Nonah Mattes RN,IBCLC Breastfeeding experience: feeding better, still supplementing post BF   Maternal medications: Pre-natal vitamin, Other(Fish Oil, Mother's Milk Tea, Blessed Thistle)  Breastfeeding History: Frequency of breast feeding: every 2-3 hours Duration of feeding: 30 minutes, 15 minutes on each breast, left breast more sensitive  Supplementation: Supplement method: bottle(Dr. Brown's) Brand: Similac Formula volume: 1/2-1 ounce Formula frequency: 8 x a day Total formula volume per day: 4-8 ounces Breast milk volume: 1 ounce Breast milk frequency: 5-6 x a day   Pump type: Other(Medela Free Style) Pump frequency: 6 x a day Pump volume: 1/2-2 ounces  Infant Output Assessment: Voids per 24 hours: 12 Urine color: Clear yellow Stools per 24 hours: 1-2 Stool color: Yellow  Breast Assessment: Breast: Filling, Compressible Nipple: Erect Pain level: 3 Pain interventions: Bra, Coconut oil, Hydrogel dressing  Feeding Assessment: Infant oral assessment: Variance Infant oral assessment comment: Infant with thick labial  frenulum that inserts at the bottom of the gum ridge, upper lip is tight with flanging. Infant with strong suckle on gloved finger with good tongue extension and lateralization. Infant with posterior lingual frenulum with good tongue lateralization and extension. Infant with  limited mid tongue elevation. Positioning: Cross cradle(left breast) Latch: 2 - Grasps breast easily, tongue down, lips flanged, rhythmical sucking. Audible swallowing: 2 - Spontaneous and intermittent Type of nipple: 2 - Everted at rest and after stimulation Comfort: 1 - Filling, red/small blisters or bruises, mild/mod discomfort Hold: 2 - No assistance needed to correctly position infant at breast LATCH score: 9 Latch assessment: Deep Lips flanged: Yes Suck assessment: Displays both   Pre-feed weight: 4038 grams Post feed weight: 4086 grams Amount transferred: 48 ml Amount supplemented: 0  Additional Feeding Assessment: Infant oral assessment: Variance Infant oral assessment comment: Infant with thick labial frenulum that inserts at the bottom of the gum ridge, upper lip is tight with flanging. Infant with strong suckle on gloved finger with good tongue extension and lateralization. Infant with posterior lingual frenulum with good tongue lateralization and extension. Infant with limited mid tongue elevation. Positioning: Cross cradle(right breast) Latch: 2 - Grasps breast easily, tongue down, lips flanged, rhythmical sucking. Audible swallowing: 2 - Spontaneous and intermittent Type of nipple: 2 - Everted at rest and after stimulation Comfort: 1 - Filling, red/small blisters or bruises, mild/mod discomfort Hold: 2 - No assistance needed to correctly position infant at breast LATCH score: 9 Latch assessment: Deep Lips flanged: Yes Suck assessment: Displays both   Pre-feed weight: 4086 grams Post feed weight: 4118 grams Amount transferred: 32 ml Amount supplemented: 0  Totals: Total amount transferred: 80 ml Total supplement given: 0 Total amount pumped post feed: did not pump   Plan:  1. Offer breast with feeding cues as mom and infant desire, Limit breast feeding to 20-30 minutes if he is not actively nursing 2. Use the # 24 Nipple Shield with feedings as needed for  pain 3. Keep infant awake at the breast as needed , feed infant skin to skin 4. Massage/compress breast with feeding 5. Offer infant bottle of pumped breast milk after breast feeding, especially if he is cueing to feed 6. Continue to use the Dr. Saul Fordyce nipple for feeding 7. Use the paced bottle feeding method to supplement infant (video on [email or web address]) 8. Infant needs about 75-100 ml (2.5-3.5 ounces) for 8 feedings a day or 600-800 ml (20-27 ounces) in 24 hours. Infant may take more or less per feeding depending on how often he feeds 9 . Continue pumping 6-8 times a day after breast feeding for 15-20 minutes to protect milk supply. Use your double electric breast pump 10. Consider having infant evaluated by an Oral Specialist especially if we do not see improvement in his feeding 11. Keep up the good work 74. Thank you for allowing me to assist you today 13. Please call with any questions/concerns as needed [phone] 14. Follow up with Lactation in 1 week   [place], IBCLC                                                     Donn Pierini 12/05/2017, 12:15 PM

## 2018-11-25 DIAGNOSIS — D2262 Melanocytic nevi of left upper limb, including shoulder: Secondary | ICD-10-CM | POA: Diagnosis not present

## 2018-11-25 DIAGNOSIS — D2221 Melanocytic nevi of right ear and external auricular canal: Secondary | ICD-10-CM | POA: Diagnosis not present

## 2018-11-25 DIAGNOSIS — D2261 Melanocytic nevi of right upper limb, including shoulder: Secondary | ICD-10-CM | POA: Diagnosis not present

## 2018-11-25 DIAGNOSIS — D485 Neoplasm of uncertain behavior of skin: Secondary | ICD-10-CM | POA: Diagnosis not present

## 2018-11-25 DIAGNOSIS — D2271 Melanocytic nevi of right lower limb, including hip: Secondary | ICD-10-CM | POA: Diagnosis not present

## 2018-11-25 DIAGNOSIS — D2272 Melanocytic nevi of left lower limb, including hip: Secondary | ICD-10-CM | POA: Diagnosis not present

## 2018-11-25 DIAGNOSIS — D225 Melanocytic nevi of trunk: Secondary | ICD-10-CM | POA: Diagnosis not present

## 2018-11-25 DIAGNOSIS — L720 Epidermal cyst: Secondary | ICD-10-CM | POA: Diagnosis not present

## 2018-12-15 DIAGNOSIS — Z1211 Encounter for screening for malignant neoplasm of colon: Secondary | ICD-10-CM | POA: Diagnosis not present

## 2018-12-15 DIAGNOSIS — Z8371 Family history of colonic polyps: Secondary | ICD-10-CM | POA: Diagnosis not present

## 2018-12-15 DIAGNOSIS — K625 Hemorrhage of anus and rectum: Secondary | ICD-10-CM | POA: Diagnosis not present

## 2018-12-15 DIAGNOSIS — K600 Acute anal fissure: Secondary | ICD-10-CM | POA: Diagnosis not present

## 2018-12-23 DIAGNOSIS — K51211 Ulcerative (chronic) proctitis with rectal bleeding: Secondary | ICD-10-CM | POA: Diagnosis not present

## 2018-12-23 DIAGNOSIS — K6289 Other specified diseases of anus and rectum: Secondary | ICD-10-CM | POA: Diagnosis not present

## 2018-12-23 DIAGNOSIS — K625 Hemorrhage of anus and rectum: Secondary | ICD-10-CM | POA: Diagnosis not present

## 2018-12-23 DIAGNOSIS — Z1211 Encounter for screening for malignant neoplasm of colon: Secondary | ICD-10-CM | POA: Diagnosis not present

## 2019-03-26 NOTE — L&D Delivery Note (Signed)
Delivery Note Cynthia Campbell is a G2P1001 at [redacted]w[redacted]d who had a spontaneous delivery at 01:01am on 11/16/19   a viable female "Grayce Sessions" was delivered via ROA. APGAR: 8, 9; weight 3989g (8lb12.7oz)  Admitted for labor. Augmented with AROM. Progressed normally. Pushed for 10 minutes. Received epidural for pain management. Foley balloon was spontaneously expelled from the bladder during pushing. Periurethral laceration was hemostatic with pressure. Baby was delivered without difficulty. No nuchal cord. Infant placed maternal abdomen. Delayed cord clamping for 60 seconds. Delivery of placenta was spontaneous. Placenta was found to be intact, 3-vessel cord was noted. The fundus was found to be firm and the lower uterine segmant was cleared of clot x1. Cervix examined and intact. 2nd degree perineal laceration was repaired in the normal sterile fashion with 2-0 vicryl. DRE with good rectal tone and no sutures. Estimated blood loss 200cc. Instrument and gauze counts were correct at the end of the procedure.  Placenta status: L&D Mom to postpartum.  Baby to Couplet care / Skin to Skin.  Rajvir Ernster K Taam-Akelman 11/16/2019, 1:21 AM

## 2019-04-20 DIAGNOSIS — Z113 Encounter for screening for infections with a predominantly sexual mode of transmission: Secondary | ICD-10-CM | POA: Diagnosis not present

## 2019-04-20 DIAGNOSIS — Z124 Encounter for screening for malignant neoplasm of cervix: Secondary | ICD-10-CM | POA: Diagnosis not present

## 2019-04-20 DIAGNOSIS — Z3480 Encounter for supervision of other normal pregnancy, unspecified trimester: Secondary | ICD-10-CM | POA: Diagnosis not present

## 2019-04-20 DIAGNOSIS — N925 Other specified irregular menstruation: Secondary | ICD-10-CM | POA: Diagnosis not present

## 2019-04-20 DIAGNOSIS — Z3201 Encounter for pregnancy test, result positive: Secondary | ICD-10-CM | POA: Diagnosis not present

## 2019-05-12 DIAGNOSIS — Z369 Encounter for antenatal screening, unspecified: Secondary | ICD-10-CM | POA: Diagnosis not present

## 2019-05-12 DIAGNOSIS — Z3480 Encounter for supervision of other normal pregnancy, unspecified trimester: Secondary | ICD-10-CM | POA: Diagnosis not present

## 2019-05-12 LAB — OB RESULTS CONSOLE RUBELLA ANTIBODY, IGM: Rubella: IMMUNE

## 2019-05-12 LAB — OB RESULTS CONSOLE RPR: RPR: NONREACTIVE

## 2019-05-12 LAB — OB RESULTS CONSOLE HEPATITIS B SURFACE ANTIGEN: Hepatitis B Surface Ag: NEGATIVE

## 2019-05-12 LAB — OB RESULTS CONSOLE HIV ANTIBODY (ROUTINE TESTING): HIV: NONREACTIVE

## 2019-06-09 DIAGNOSIS — Z369 Encounter for antenatal screening, unspecified: Secondary | ICD-10-CM | POA: Diagnosis not present

## 2019-06-09 DIAGNOSIS — Z3482 Encounter for supervision of other normal pregnancy, second trimester: Secondary | ICD-10-CM | POA: Diagnosis not present

## 2019-06-30 DIAGNOSIS — Z363 Encounter for antenatal screening for malformations: Secondary | ICD-10-CM | POA: Diagnosis not present

## 2019-07-28 DIAGNOSIS — Z369 Encounter for antenatal screening, unspecified: Secondary | ICD-10-CM | POA: Diagnosis not present

## 2019-09-06 DIAGNOSIS — Z3480 Encounter for supervision of other normal pregnancy, unspecified trimester: Secondary | ICD-10-CM | POA: Diagnosis not present

## 2019-09-06 DIAGNOSIS — Z23 Encounter for immunization: Secondary | ICD-10-CM | POA: Diagnosis not present

## 2019-09-06 DIAGNOSIS — O360990 Maternal care for other rhesus isoimmunization, unspecified trimester, not applicable or unspecified: Secondary | ICD-10-CM | POA: Diagnosis not present

## 2019-09-06 LAB — OB RESULTS CONSOLE RPR: RPR: NONREACTIVE

## 2019-09-22 DIAGNOSIS — Z369 Encounter for antenatal screening, unspecified: Secondary | ICD-10-CM | POA: Diagnosis not present

## 2019-10-07 DIAGNOSIS — Z369 Encounter for antenatal screening, unspecified: Secondary | ICD-10-CM | POA: Diagnosis not present

## 2019-10-20 DIAGNOSIS — Z369 Encounter for antenatal screening, unspecified: Secondary | ICD-10-CM | POA: Diagnosis not present

## 2019-10-29 DIAGNOSIS — Z3480 Encounter for supervision of other normal pregnancy, unspecified trimester: Secondary | ICD-10-CM | POA: Diagnosis not present

## 2019-10-29 DIAGNOSIS — Z369 Encounter for antenatal screening, unspecified: Secondary | ICD-10-CM | POA: Diagnosis not present

## 2019-10-29 LAB — OB RESULTS CONSOLE GBS: GBS: NEGATIVE

## 2019-11-05 DIAGNOSIS — Z369 Encounter for antenatal screening, unspecified: Secondary | ICD-10-CM | POA: Diagnosis not present

## 2019-11-10 ENCOUNTER — Other Ambulatory Visit: Payer: Self-pay | Admitting: Obstetrics and Gynecology

## 2019-11-10 DIAGNOSIS — Z369 Encounter for antenatal screening, unspecified: Secondary | ICD-10-CM | POA: Diagnosis not present

## 2019-11-15 ENCOUNTER — Inpatient Hospital Stay (EMERGENCY_DEPARTMENT_HOSPITAL)
Admission: AD | Admit: 2019-11-15 | Discharge: 2019-11-15 | Disposition: A | Discharge status: Home / Self Care | Payer: 59 | Source: Home / Self Care | Attending: Obstetrics & Gynecology | Admitting: Obstetrics & Gynecology

## 2019-11-15 ENCOUNTER — Encounter (HOSPITAL_COMMUNITY): Payer: Self-pay | Admitting: *Deleted

## 2019-11-15 ENCOUNTER — Inpatient Hospital Stay (HOSPITAL_COMMUNITY): Payer: 59 | Admitting: Anesthesiology

## 2019-11-15 ENCOUNTER — Other Ambulatory Visit: Payer: Self-pay

## 2019-11-15 ENCOUNTER — Inpatient Hospital Stay (HOSPITAL_COMMUNITY)
Admission: AD | Admit: 2019-11-15 | Discharge: 2019-11-17 | DRG: 807 | Disposition: A | Discharge status: Home / Self Care | Payer: 59 | Attending: Obstetrics and Gynecology | Admitting: Obstetrics and Gynecology

## 2019-11-15 ENCOUNTER — Encounter (HOSPITAL_COMMUNITY): Payer: Self-pay | Admitting: Obstetrics & Gynecology

## 2019-11-15 DIAGNOSIS — Z3A38 38 weeks gestation of pregnancy: Secondary | ICD-10-CM

## 2019-11-15 DIAGNOSIS — Z3A39 39 weeks gestation of pregnancy: Secondary | ICD-10-CM | POA: Diagnosis not present

## 2019-11-15 DIAGNOSIS — O9962 Diseases of the digestive system complicating childbirth: Secondary | ICD-10-CM | POA: Diagnosis not present

## 2019-11-15 DIAGNOSIS — O4693 Antepartum hemorrhage, unspecified, third trimester: Secondary | ICD-10-CM | POA: Insufficient documentation

## 2019-11-15 DIAGNOSIS — K219 Gastro-esophageal reflux disease without esophagitis: Secondary | ICD-10-CM | POA: Diagnosis not present

## 2019-11-15 DIAGNOSIS — Z3689 Encounter for other specified antenatal screening: Secondary | ICD-10-CM | POA: Diagnosis not present

## 2019-11-15 DIAGNOSIS — Z3490 Encounter for supervision of normal pregnancy, unspecified, unspecified trimester: Secondary | ICD-10-CM | POA: Diagnosis present

## 2019-11-15 DIAGNOSIS — O471 False labor at or after 37 completed weeks of gestation: Secondary | ICD-10-CM | POA: Insufficient documentation

## 2019-11-15 DIAGNOSIS — O26893 Other specified pregnancy related conditions, third trimester: Secondary | ICD-10-CM | POA: Diagnosis present

## 2019-11-15 DIAGNOSIS — Z6791 Unspecified blood type, Rh negative: Secondary | ICD-10-CM

## 2019-11-15 DIAGNOSIS — Z20822 Contact with and (suspected) exposure to covid-19: Secondary | ICD-10-CM | POA: Diagnosis present

## 2019-11-15 LAB — CBC
HCT: 39.5 % (ref 36.0–46.0)
Hemoglobin: 13.5 g/dL (ref 12.0–15.0)
MCH: 31.1 pg (ref 26.0–34.0)
MCHC: 34.2 g/dL (ref 30.0–36.0)
MCV: 91 fL (ref 80.0–100.0)
Platelets: 233 10*3/uL (ref 150–400)
RBC: 4.34 MIL/uL (ref 3.87–5.11)
RDW: 13.2 % (ref 11.5–15.5)
WBC: 15.5 10*3/uL — ABNORMAL HIGH (ref 4.0–10.5)
nRBC: 0 % (ref 0.0–0.2)

## 2019-11-15 LAB — SARS CORONAVIRUS 2 BY RT PCR (HOSPITAL ORDER, PERFORMED IN ~~LOC~~ HOSPITAL LAB): SARS Coronavirus 2: NEGATIVE

## 2019-11-15 MED ORDER — FLEET ENEMA 7-19 GM/118ML RE ENEM: 1.0000 | ENEMA | Freq: Every day | RECTAL | Status: DC | PRN

## 2019-11-15 MED ORDER — OXYCODONE-ACETAMINOPHEN 5-325 MG PO TABS: 1.0000 | ORAL_TABLET | ORAL | Status: DC | PRN

## 2019-11-15 MED ORDER — EPHEDRINE 5 MG/ML INJ: 10.0000 mg | INTRAVENOUS | Status: DC | PRN

## 2019-11-15 MED ORDER — OXYTOCIN-SODIUM CHLORIDE 30-0.9 UT/500ML-% IV SOLN: 1.0000 m[IU]/min | INTRAVENOUS | Status: DC

## 2019-11-15 MED ORDER — FENTANYL-BUPIVACAINE-NACL 0.5-0.125-0.9 MG/250ML-% EP SOLN: 12.0000 mL/h | EPIDURAL | Status: DC | PRN

## 2019-11-15 MED ORDER — LACTATED RINGERS IV SOLN
500.0000 mL | Freq: Once | INTRAVENOUS | Status: AC
  Administered 2019-11-15: 500 mL via INTRAVENOUS

## 2019-11-15 MED ORDER — LIDOCAINE HCL (PF) 1 % IJ SOLN
INTRAMUSCULAR | Status: DC | PRN
  Administered 2019-11-15 (×2): 4 mL via EPIDURAL
  Administered 2019-11-15: 3 mL via EPIDURAL

## 2019-11-15 MED ORDER — LIDOCAINE HCL (PF) 1 % IJ SOLN: 30.0000 mL | INTRAMUSCULAR | Status: DC | PRN

## 2019-11-15 MED ORDER — PHENYLEPHRINE 40 MCG/ML (10ML) SYRINGE FOR IV PUSH (FOR BLOOD PRESSURE SUPPORT)
80.0000 ug | PREFILLED_SYRINGE | INTRAVENOUS | Status: DC | PRN
  Administered 2019-11-15 (×2): 80 ug via INTRAVENOUS

## 2019-11-15 MED ORDER — LACTATED RINGERS IV SOLN: INTRAVENOUS | Status: DC

## 2019-11-15 MED ORDER — OXYTOCIN-SODIUM CHLORIDE 30-0.9 UT/500ML-% IV SOLN
2.5000 [IU]/h | INTRAVENOUS | Status: DC
  Administered 2019-11-16: 2.5 [IU]/h via INTRAVENOUS
  Filled 2019-11-15: qty 500

## 2019-11-15 MED ORDER — LACTATED RINGERS IV SOLN
500.0000 mL | INTRAVENOUS | Status: DC | PRN
  Administered 2019-11-15: 500 mL via INTRAVENOUS

## 2019-11-15 MED ORDER — ACETAMINOPHEN 325 MG PO TABS: 650.0000 mg | ORAL_TABLET | ORAL | Status: DC | PRN

## 2019-11-15 MED ORDER — ONDANSETRON HCL 4 MG/2ML IJ SOLN: 4.0000 mg | Freq: Four times a day (QID) | INTRAMUSCULAR | Status: DC | PRN

## 2019-11-15 MED ORDER — SODIUM CHLORIDE (PF) 0.9 % IJ SOLN
INTRAMUSCULAR | Status: DC | PRN
  Administered 2019-11-15: 13.5 mL/h via EPIDURAL

## 2019-11-15 MED ORDER — SOD CITRATE-CITRIC ACID 500-334 MG/5ML PO SOLN: 30.0000 mL | ORAL | Status: DC | PRN

## 2019-11-15 MED ORDER — DIPHENHYDRAMINE HCL 50 MG/ML IJ SOLN: 12.5000 mg | INTRAMUSCULAR | Status: DC | PRN

## 2019-11-15 MED ORDER — OXYCODONE-ACETAMINOPHEN 5-325 MG PO TABS: 2.0000 | ORAL_TABLET | ORAL | Status: DC | PRN

## 2019-11-15 MED ORDER — PHENYLEPHRINE 40 MCG/ML (10ML) SYRINGE FOR IV PUSH (FOR BLOOD PRESSURE SUPPORT)
80.0000 ug | PREFILLED_SYRINGE | INTRAVENOUS | Status: DC | PRN
  Filled 2019-11-15: qty 10

## 2019-11-15 MED ORDER — TERBUTALINE SULFATE 1 MG/ML IJ SOLN: 0.2500 mg | Freq: Once | INTRAMUSCULAR | Status: DC | PRN

## 2019-11-15 MED ORDER — FENTANYL-BUPIVACAINE-NACL 0.5-0.125-0.9 MG/250ML-% EP SOLN
EPIDURAL | Status: AC
Start: 2019-11-15 — End: 2019-11-16
  Filled 2019-11-15: qty 250

## 2019-11-15 MED ORDER — OXYTOCIN BOLUS FROM INFUSION
333.0000 mL | Freq: Once | INTRAVENOUS | Status: AC
  Administered 2019-11-16: 333 mL via INTRAVENOUS

## 2019-11-15 NOTE — MAU Note (Signed)
Pt reports to MAU stating she is having ctx every 1-2 min. Pt report some bleeding no LOF. +FM.

## 2019-11-15 NOTE — MAU Note (Signed)
Pt states she lost her mucus plug Friday. States she had some pink spotting this morning enough to wear a pad but was not soaking a pad. Discharge has gotten darker and less in quantity. Started having contractions this morning that had gotten stronger but states they are not as bad right now. Reports good fetal movement.

## 2019-11-15 NOTE — Discharge Instructions (Signed)

## 2019-11-15 NOTE — MAU Provider Note (Signed)
None    S: Ms. Cynthia Campbell is a 34 y.o. G2P1001 at [redacted]w[redacted]d  who presents to MAU today complaining of contractions since early this morning. She endorses vaginal bleeding. She denies LOF. She reports normal fetal movement.    O: BP 127/80 (BP Location: Right Arm)   Temp 98 F (36.7 C) (Oral)   Resp 16   SpO2 100% Comment: room air GENERAL: Well-developed, well-nourished female in no acute distress.  HEAD: Normocephalic, atraumatic.  CHEST: Normal effort of breathing, regular heart rate ABDOMEN: Soft, nontender, gravid  Cervical exam:  Dilation: 2 Effacement (%): 60 Station: -2 Presentation: Vertex Exam by:: AThurman Coyer, RN   Fetal Monitoring: Baseline: 140 Variability: Mod Accelerations: 15 x 15 Decelerations: None Contractions: Irregular q 2-4   A: SIUP at [redacted]w[redacted]d Scan bleeding c/w bloody show  Cervix unchanged one hour after arrival Likely early labor  P: Discharge home in stable condition with labor precautions  F/U: Patient has IOL tomorrow 08/24  Mallie Snooks, [place] 11/15/2019 2:39 PM

## 2019-11-15 NOTE — Anesthesia Preprocedure Evaluation (Signed)
Anesthesia Evaluation  Patient identified by MRN, date of birth, ID band Patient awake    Reviewed: Allergy & Precautions, Patient's Chart, lab work & pertinent test results  Airway Mallampati: II  TM Distance: >3 FB Neck ROM: Full    Dental no notable dental hx. (+) Teeth Intact   Pulmonary neg pulmonary ROS,    Pulmonary exam normal breath sounds clear to auscultation       Cardiovascular negative cardio ROS Normal cardiovascular exam Rhythm:Regular Rate:Normal     Neuro/Psych negative neurological ROS  negative psych ROS   GI/Hepatic GERD  ,  Endo/Other  negative endocrine ROS  Renal/GU negative Renal ROS  negative genitourinary   Musculoskeletal negative musculoskeletal ROS (+)   Abdominal   Peds  Hematology negative hematology ROS (+)   Anesthesia Other Findings   Reproductive/Obstetrics (+) Pregnancy                             Anesthesia Physical Anesthesia Plan  ASA: II  Anesthesia Plan: Epidural   Post-op Pain Management:    Induction:   PONV Risk Score and Plan:   Airway Management Planned: Natural Airway  Additional Equipment:   Intra-op Plan:   Post-operative Plan:   Informed Consent: I have reviewed the patients History and Physical, chart, labs and discussed the procedure including the risks, benefits and alternatives for the proposed anesthesia with the patient or authorized representative who has indicated his/her understanding and acceptance.       Plan Discussed with: Anesthesiologist  Anesthesia Plan Comments:         Anesthesia Quick Evaluation

## 2019-11-15 NOTE — Anesthesia Procedure Notes (Signed)
Epidural Patient location during procedure: OB Start time: 11/15/2019 10:00 PM End time: 11/15/2019 10:07 PM  Staffing Anesthesiologist: Josephine Igo, MD Performed: anesthesiologist   Preanesthetic Checklist Completed: patient identified, IV checked, site marked, risks and benefits discussed, surgical consent, monitors and equipment checked, pre-op evaluation and timeout performed  Epidural Patient position: sitting Prep: DuraPrep and site prepped and draped Patient monitoring: continuous pulse ox and blood pressure Approach: midline Location: L3-L4 Injection technique: LOR air  Needle:  Needle type: Tuohy  Needle gauge: 17 G Needle length: 9 cm and 9 Needle insertion depth: 5 cm cm Catheter type: closed end flexible Catheter size: 19 Gauge Catheter at skin depth: 10 cm Test dose: negative and Other  Assessment Events: blood not aspirated, injection not painful, no injection resistance, no paresthesia and negative IV test  Additional Notes Patient identified. Risks and benefits discussed including failed block, incomplete  Pain control, post dural puncture headache, nerve damage, paralysis, blood pressure Changes, nausea, vomiting, reactions to medications-both toxic and allergic and post Partum back pain. All questions were answered. Patient expressed understanding and wished to proceed. Sterile technique was used throughout procedure. Epidural site was Dressed with sterile barrier dressing. No paresthesias, signs of intravascular injection Or signs of intrathecal spread were encountered.  Patient was more comfortable after the epidural was dosed. Please see RN's note for documentation of vital signs and FHR which are stable. Reason for block:procedure for pain

## 2019-11-15 NOTE — H&P (Signed)
Cynthia Campbell is a 34 y.o. female G2P1001 [redacted]w[redacted]d presenting for labor check. She reports regular contractions and some bleeding. Denies LOF. Reports normal FM.   Pregnancy c/b 1. Rh negative: S/p rhogam 09/08/2019  OB History    Gravida  2   Para  1   Term  1   Preterm      AB      Living  1     SAB      TAB      Ectopic      Multiple  0   Live Births  1          Past Medical History:  Diagnosis Date  . Medical history non-contributory    Past Surgical History:  Procedure Laterality Date  . WISDOM TOOTH EXTRACTION     Family History: family history is not on file. Social History:  reports that she has never smoked. She has never used smokeless tobacco. She reports previous alcohol use. She reports that she does not use drugs.     Maternal Diabetes: No Genetic Screening: Normal - MaterniT21 low risk Maternal Ultrasounds/Referrals: Normal anatomy, placenta anterior Fetal Ultrasounds or other Referrals:  None Maternal Substance Abuse:  No Significant Maternal Medications:  None Significant Maternal Lab Results:  Group B Strep negative Other Comments:  None  Review of Systems Per HPI Exam Physical Exam  Dilation: 3 Effacement (%): 70 Station: -2 Exam by:: Alexx Gagliardo RN  Blood pressure 132/80, pulse 84, temperature 97.9 F (36.6 C), temperature source Oral, resp. rate 18, unknown if currently breastfeeding. Fetal testing: FHR 130, Cat 1 Toco not tracing well, appears q48mins Prenatal labs: O negative Rubella: Immune (02/17 0000) RPR: Nonreactive (06/14 0000)  HBsAg: Negative (02/17 0000)  HIV: Non-reactive (02/17 0000)  GBS: Negative/-- (08/06 0000)   Assessment/Plan: Cynthia Campbell 34 y.o. G2P1001 at [redacted]w[redacted]d here for augmentation term labor 1. Labor: changed from 3cm to 4cm in MAU. Augment with pitocin/AROM prn.  2. Rh neg: rhogam PP pending baby  Cynthia Campbell 11/15/2019, 9:21 PM

## 2019-11-15 NOTE — Progress Notes (Signed)
OBGYN Note S: comfortable with epidural O Vitals:   11/15/19 2230 11/15/19 2237 11/15/19 2241 11/15/19 2301  BP: 104/67 (!) 103/49 110/67 (!) 106/51  Pulse: 76 85 81 75  Resp:      Temp:      TempSrc:      SpO2: 99%     Weight:      Height:       SVE 4/50/-2, AROM c/f FHR 140, Cat 1 Toco q80m A/p: Cynthia Campbell Len 34 y.o. G2P1001 at [redacted]w[redacted]d augmentation of labor  -Add pitocin prn -Anticipate SVD Cynthia Campbell K Taam-Akelman 11/15/19 11:03 PM

## 2019-11-16 ENCOUNTER — Encounter (HOSPITAL_COMMUNITY): Payer: Self-pay | Admitting: Obstetrics and Gynecology

## 2019-11-16 ENCOUNTER — Inpatient Hospital Stay (HOSPITAL_COMMUNITY): Payer: 59

## 2019-11-16 ENCOUNTER — Inpatient Hospital Stay (HOSPITAL_COMMUNITY): Admission: AD | Admit: 2019-11-16 | Payer: 59 | Source: Home / Self Care | Admitting: Obstetrics and Gynecology

## 2019-11-16 LAB — CBC
HCT: 32.8 % — ABNORMAL LOW (ref 36.0–46.0)
Hemoglobin: 11.8 g/dL — ABNORMAL LOW (ref 12.0–15.0)
MCH: 33.1 pg (ref 26.0–34.0)
MCHC: 36 g/dL (ref 30.0–36.0)
MCV: 92.1 fL (ref 80.0–100.0)
Platelets: 188 10*3/uL (ref 150–400)
RBC: 3.56 MIL/uL — ABNORMAL LOW (ref 3.87–5.11)
RDW: 13.1 % (ref 11.5–15.5)
WBC: 19.5 10*3/uL — ABNORMAL HIGH (ref 4.0–10.5)
nRBC: 0 % (ref 0.0–0.2)

## 2019-11-16 LAB — RPR: RPR Ser Ql: NONREACTIVE

## 2019-11-16 MED ORDER — SODIUM CHLORIDE 0.9% FLUSH: 3.0000 mL | INTRAVENOUS | Status: DC | PRN

## 2019-11-16 MED ORDER — OXYCODONE HCL 5 MG PO TABS
5.0000 mg | ORAL_TABLET | ORAL | Status: DC | PRN
  Administered 2019-11-16 (×2): 5 mg via ORAL
  Filled 2019-11-16 (×2): qty 1

## 2019-11-16 MED ORDER — RHO D IMMUNE GLOBULIN 1500 UNIT/2ML IJ SOSY
300.0000 ug | PREFILLED_SYRINGE | Freq: Once | INTRAMUSCULAR | Status: AC
  Administered 2019-11-16: 300 ug via INTRAVENOUS
  Filled 2019-11-16: qty 2

## 2019-11-16 MED ORDER — SODIUM CHLORIDE 0.9 % IV SOLN: 250.0000 mL | INTRAVENOUS | Status: DC | PRN

## 2019-11-16 MED ORDER — DIBUCAINE (PERIANAL) 1 % EX OINT: 1.0000 "application " | TOPICAL_OINTMENT | CUTANEOUS | Status: DC | PRN

## 2019-11-16 MED ORDER — SIMETHICONE 80 MG PO CHEW: 80.0000 mg | CHEWABLE_TABLET | ORAL | Status: DC | PRN

## 2019-11-16 MED ORDER — ONDANSETRON HCL 4 MG PO TABS: 4.0000 mg | ORAL_TABLET | ORAL | Status: DC | PRN

## 2019-11-16 MED ORDER — IBUPROFEN 600 MG PO TABS
600.0000 mg | ORAL_TABLET | Freq: Four times a day (QID) | ORAL | Status: DC
  Administered 2019-11-16 – 2019-11-17 (×6): 600 mg via ORAL
  Filled 2019-11-16 (×6): qty 1

## 2019-11-16 MED ORDER — BENZOCAINE-MENTHOL 20-0.5 % EX AERO
1.0000 "application " | INHALATION_SPRAY | CUTANEOUS | Status: DC | PRN
  Administered 2019-11-16: 1 via TOPICAL
  Filled 2019-11-16: qty 56

## 2019-11-16 MED ORDER — OXYCODONE HCL 5 MG PO TABS
10.0000 mg | ORAL_TABLET | ORAL | Status: DC | PRN
  Administered 2019-11-16 – 2019-11-17 (×3): 10 mg via ORAL
  Filled 2019-11-16 (×3): qty 2

## 2019-11-16 MED ORDER — WITCH HAZEL-GLYCERIN EX PADS: 1.0000 "application " | MEDICATED_PAD | CUTANEOUS | Status: DC | PRN

## 2019-11-16 MED ORDER — PRENATAL MULTIVITAMIN CH
1.0000 | ORAL_TABLET | Freq: Every day | ORAL | Status: DC
  Administered 2019-11-16 – 2019-11-17 (×2): 1 via ORAL
  Filled 2019-11-16 (×2): qty 1

## 2019-11-16 MED ORDER — DIPHENHYDRAMINE HCL 25 MG PO CAPS: 25.0000 mg | ORAL_CAPSULE | Freq: Four times a day (QID) | ORAL | Status: DC | PRN

## 2019-11-16 MED ORDER — ACETAMINOPHEN 325 MG PO TABS
650.0000 mg | ORAL_TABLET | ORAL | Status: DC | PRN
  Administered 2019-11-16 (×3): 650 mg via ORAL
  Filled 2019-11-16 (×3): qty 2

## 2019-11-16 MED ORDER — ONDANSETRON HCL 4 MG/2ML IJ SOLN: 4.0000 mg | INTRAMUSCULAR | Status: DC | PRN

## 2019-11-16 MED ORDER — SODIUM CHLORIDE 0.9% FLUSH: 3.0000 mL | Freq: Two times a day (BID) | INTRAVENOUS | Status: DC

## 2019-11-16 MED ORDER — DOCUSATE SODIUM 100 MG PO CAPS
100.0000 mg | ORAL_CAPSULE | Freq: Two times a day (BID) | ORAL | Status: DC
  Administered 2019-11-16 – 2019-11-17 (×2): 100 mg via ORAL
  Filled 2019-11-16 (×2): qty 1

## 2019-11-16 MED ORDER — COCONUT OIL OIL
1.0000 "application " | TOPICAL_OIL | Status: DC | PRN
  Administered 2019-11-17: 1 via TOPICAL

## 2019-11-16 NOTE — Anesthesia Postprocedure Evaluation (Signed)
Anesthesia Post Note  Patient: Cynthia Campbell  Procedure(s) Performed: AN AD HOC LABOR EPIDURAL     Patient location during evaluation: Mother Baby Anesthesia Type: Epidural Level of consciousness: awake Pain management: satisfactory to patient Vital Signs Assessment: post-procedure vital signs reviewed and stable Respiratory status: spontaneous breathing Cardiovascular status: stable Anesthetic complications: no   No complications documented.  Last Vitals:  Vitals:   11/16/19 0335 11/16/19 0445  BP: 120/74 112/64  Pulse: 69 67  Resp: 18 20  Temp: 36.8 C 36.8 C  SpO2: 99% 99%    Last Pain:  Vitals:   11/16/19 0445  TempSrc: Oral  PainSc:    Pain Goal:                   [employer]

## 2019-11-16 NOTE — Lactation Note (Signed)
This note was copied from a baby's chart. Lactation Consultation Note  Patient Name: Cynthia Campbell ASNKN'L Date: 11/16/2019   Spoke with RN, Megan Mans who reports if mom wants to see lactation she will put an order in.  Maternal Data    Feeding Feeding Type: Breast Fed  LATCH Score                   Interventions    Lactation Tools Discussed/Used     Consult Status      Helio Lack Thompson Caul 11/16/2019, 3:00 PM

## 2019-11-16 NOTE — Anesthesia Postprocedure Evaluation (Signed)
Anesthesia Post Note  Patient: Cynthia Campbell  Procedure(s) Performed: AN AD HOC LABOR EPIDURAL     Anesthesia Post Evaluation No complications documented.  Last Vitals:  Vitals:   11/16/19 0335 11/16/19 0445  BP: 120/74 112/64  Pulse: 69 67  Resp: 18 20  Temp: 36.8 C 36.8 C  SpO2: 99% 99%    Last Pain:  Vitals:   11/16/19 0445  TempSrc: Oral  PainSc:    Pain Goal:                   [employer]

## 2019-11-16 NOTE — Progress Notes (Signed)
Patient is eating, ambulating, voiding.  Pain control is good.  Vitals:   11/16/19 0254 11/16/19 0301 11/16/19 0335 11/16/19 0445  BP: 110/67 119/63 120/74 112/64  Pulse: 72 70 69 67  Resp:   18 20  Temp:  98.1 F (36.7 C) 98.3 F (36.8 C) 98.3 F (36.8 C)  TempSrc:  Oral Oral Oral  SpO2:   99% 99%  Weight:      Height:        Fundus firm Perineum without swelling.  Lab Results  Component Value Date   WBC 19.5 (H) 11/16/2019   HGB 11.8 (L) 11/16/2019   HCT 32.8 (L) 11/16/2019   MCV 92.1 11/16/2019   PLT 188 11/16/2019    --/--/O NEG (08/23 2120)/RI  A/P Post partum day.   Baby is RH +, needs Rhogam.   Routine care.  Expect d/c tomorrow.    Cynthia Campbell

## 2019-11-16 NOTE — Plan of Care (Signed)
  Problem: Education: Goal: Knowledge of condition will improve Outcome: Progressing

## 2019-11-16 NOTE — Lactation Note (Signed)
This note was copied from a baby's chart. Lactation Consultation Note  Patient Name: Cynthia Campbell [id number] Date: 11/16/2019 Reason for consult: Initial assessment;Term  Mom is an experienced breastfeeding mom.  Baby Cynthia Pardoe now 23 hours old.  Mom reports nipples starting to get sore.  Mom reports sore nipples with last baby.  Discussed ways to help with sore nipples.  Urged mom to vary breastfeeding positions.  Hand express and/or prepump prior to latching to help milk be there. Hand express and rub expressed mothers milk on nipples and air dry.  Then use coconut oil as needed.  Vary breastfeeding positions.  Check nipples shape past breastfeeding.  Infant cuing on arrival.  Asked mom if she had ever tried laid back breastfeeding.  She reported no.  Laid mom back, and assisted infant in breastfeeding. Infant latched well.  Showed mom properr alignment, how cheeks and chin supposed to touch breast.  Mom reports comfort.   Gave mom information regarding breastpumps for [employer] employee.  Mom knows which pumps to choose from. Gave mom [facility] breastfeeding Consultation Services handout.    Cynthia Campbell 11/16/2019, 7:58 PM

## 2019-11-17 LAB — TYPE AND SCREEN
ABO/RH(D): O NEG
Antibody Screen: POSITIVE

## 2019-11-17 LAB — RH IG WORKUP (INCLUDES ABO/RH)
ABO/RH(D): O NEG
Fetal Screen: NEGATIVE
Gestational Age(Wks): 39
Unit division: 0

## 2019-11-17 MED ORDER — IBUPROFEN 600 MG PO TABS: 600.0000 mg | ORAL_TABLET | Freq: Four times a day (QID) | ORAL | 0 refills | Status: DC | PRN

## 2019-11-17 NOTE — Lactation Note (Signed)
This note was copied from a baby's chart. Lactation Consultation Note  Patient Name: Cynthia Campbell IRJJO'A Date: 11/17/2019  Baby Cynthia Locy now 30 hours old.  He is gone to get circumsized.  RN reports that mom will be discharged soon and is ready for her cone breastpump. Mom reports she feels breastfeeding is going pretty well.  Starting to get a little sore.  Now using expressed mothers milk and then coocnut oil.  Mom reports she feels like varying breastfeeding positions is helping. Infant with adequate voids and stools.  Parents first is a Cynthia, but quickly reviewed post circ and feeding.  Mom reports she tried hand expression and spoon feeding once. Urged mom to continue to hand express and spoon feed past breastfeeds. Mom has [facility] breastfeeding consultation list. Issued her [facility] Employee DEBP and gave mom a copy of issuance.  Urged mom to follow up with lactation as needed.  Maternal Data    Feeding Feeding Type: Breast Fed  [facility] Score                   Interventions    Lactation Tools Discussed/Used     Consult Status      Jamella Grayer Thompson Caul 11/17/2019, 2:27 PM

## 2019-11-17 NOTE — Discharge Summary (Signed)
Physician Discharge Summary  Patient ID: Cynthia Campbell MRN: [id number] DOB/AGE: 10-21-1985 34 y.o.  Admit date: 11/15/2019 Discharge date: 11/17/2019  Admission Diagnoses: term pregnancy  Discharge Diagnoses:  Active Problems:   Encounter for elective induction of labor   Discharged Condition: good  Hospital Course: Patient was admitted for IOL, labor was unremarkable, SVD female infant, unremarkable PP course  Consults: None  Significant Diagnostic Studies: labs:  Results for orders placed or performed during the hospital encounter of 11/15/19 (from the past 72 hour(s))  SARS Coronavirus 2 by RT PCR (hospital order, performed in [facility] hospital lab) Nasopharyngeal Nasopharyngeal Swab     Status: None   Collection Time: 11/15/19  9:19 PM   Specimen: Nasopharyngeal Swab  Result Value Ref Range   SARS Coronavirus 2 NEGATIVE NEGATIVE    Comment: (NOTE) SARS-CoV-2 target nucleic acids are NOT DETECTED.  The SARS-CoV-2 RNA is generally detectable in upper and lower respiratory specimens during the acute phase of infection. The lowest concentration of SARS-CoV-2 viral copies this assay can detect is 250 copies / mL. A negative result does not preclude SARS-CoV-2 infection and should not be used as the sole basis for treatment or other patient management decisions.  A negative result may occur with improper specimen collection / handling, submission of specimen other than nasopharyngeal swab, presence of viral mutation(s) within the areas targeted by this assay, and inadequate number of viral copies (<250 copies / mL). A negative result must be combined with clinical observations, patient history, and epidemiological information.  Fact Sheet for Patients:   StrictlyIdeas.no  Fact Sheet for Healthcare Providers: BankingDealers.co.za  This test is not yet approved or  cleared by the [place] FDA and has been authorized for  detection and/or diagnosis of SARS-CoV-2 by FDA under an Emergency Use Authorization (EUA).  This EUA will remain in effect (meaning this test can be used) for the duration of the COVID-19 declaration under Section 564(b)(1) of the Act, 21 U.S.C. section 360bbb-3(b)(1), unless the authorization is terminated or revoked sooner.  Performed at [facility], [place] [address] [place], [place] [address]   Type and screen     Status: None   Collection Time: 11/15/19  9:20 PM  Result Value Ref Range   ABO/RH(D) O NEG    Antibody Screen POS    Sample Expiration 11/18/2019,2359    Antibody Identification      PASSIVELY ACQUIRED ANTI-D Performed at [facility], [place] [address] [place], [place] [address]   CBC     Status: Abnormal   Collection Time: 11/15/19  9:24 PM  Result Value Ref Range   WBC 15.5 (H) 4.0 - 10.5 K/uL   RBC 4.34 3.87 - 5.11 MIL/uL   Hemoglobin 13.5 12.0 - 15.0 g/dL   HCT 39.5 36 - 46 %   MCV 91.0 80.0 - 100.0 fL   MCH 31.1 26.0 - 34.0 pg   MCHC 34.2 30.0 - 36.0 g/dL   RDW 13.2 11.5 - 15.5 %   Platelets 233 150 - 400 K/uL   nRBC 0.0 0.0 - 0.2 %    Comment: Performed at [facility], [place] [address] [place], [place] [address]  RPR     Status: None   Collection Time: 11/15/19  9:24 PM  Result Value Ref Range   RPR Ser Ql NON REACTIVE NON REACTIVE    Comment: Performed at [facility], [place] [address], [place],  [address]  CBC  Status: Abnormal   Collection Time: 11/16/19  5:48 AM  Result Value Ref Range   WBC 19.5 (H) 4.0 - 10.5 K/uL   RBC 3.56 (L) 3.87 - 5.11 MIL/uL   Hemoglobin 11.8 (L) 12.0 - 15.0 g/dL   HCT 32.8 (L) 36 - 46 %   MCV 92.1 80.0 - 100.0 fL   MCH 33.1 26.0 - 34.0 pg   MCHC 36.0 30.0 - 36.0 g/dL   RDW 13.1 11.5 - 15.5 %   Platelets 188 150 - 400 K/uL   nRBC 0.0 0.0 - 0.2 %    Comment: Performed at [facility], [place] [address] [place], [place] [address]  Rh IG workup (includes ABO/Rh)     Status: None    Collection Time: 11/16/19  5:49 AM  Result Value Ref Range   Gestational Age(Wks) 39    ABO/RH(D) O NEG    Fetal Screen NEG    Unit Number [id number]    Blood Component Type RHIG    Unit division 00    Status of Unit ISSUED,FINAL    Transfusion Status      OK TO TRANSFUSE Performed at [facility], [place] [address] [place], [place] [address]      Treatments: analgesia: epidural and vaginal delivery  Discharge Exam: Blood pressure (!) 101/50, pulse (!) 54, temperature 98.9 F (37.2 C), temperature source Oral, resp. rate 16, height 5\' 10"  (1.778 m), weight 85.7 kg, SpO2 100 %, unknown if currently breastfeeding. abd Soft, NT, ND  Disposition: Discharge disposition: 01-Home or Self Care       Discharge Instructions    Activity as tolerated   Complete by: As directed    Call MD for:  difficulty breathing, headache or visual disturbances   Complete by: As directed    Call MD for:  persistant nausea and vomiting   Complete by: As directed    Call MD for:  redness, tenderness, or signs of infection (pain, swelling, redness, odor or green/yellow discharge around incision site)   Complete by: As directed    Call MD for:  severe uncontrolled pain   Complete by: As directed    Call MD for:  temperature >100.4   Complete by: As directed    Diet - low sodium heart healthy   Complete by: As directed    Sexual activity   Complete by: As directed    No sexual activity until after your evaluation in the office     Allergies as of 11/17/2019   No Known Allergies     Medication List    STOP taking these medications   FISH OIL PO   Vitafol Ultra 29-0.6-0.4-200 MG Caps     TAKE these medications   ibuprofen 600 MG tablet Commonly known as: ADVIL Take 1 tablet (600 mg total) by mouth every 6 (six) hours as needed.       Follow-up Information    Taam-Akelman, Lawrence Santiago, MD Follow up in 4 week(s).   Specialty: Obstetrics and Gynecology Why: For a postpartum  eval Contact information: [address] [place] [address] [phone]               Signed: Vanessa Kick 11/17/2019, 12:52 PM

## 2019-12-08 DIAGNOSIS — D224 Melanocytic nevi of scalp and neck: Secondary | ICD-10-CM | POA: Diagnosis not present

## 2019-12-08 DIAGNOSIS — D2271 Melanocytic nevi of right lower limb, including hip: Secondary | ICD-10-CM | POA: Diagnosis not present

## 2019-12-08 DIAGNOSIS — L723 Sebaceous cyst: Secondary | ICD-10-CM | POA: Diagnosis not present

## 2019-12-08 DIAGNOSIS — D2272 Melanocytic nevi of left lower limb, including hip: Secondary | ICD-10-CM | POA: Diagnosis not present

## 2019-12-08 DIAGNOSIS — D225 Melanocytic nevi of trunk: Secondary | ICD-10-CM | POA: Diagnosis not present

## 2019-12-08 DIAGNOSIS — D2261 Melanocytic nevi of right upper limb, including shoulder: Secondary | ICD-10-CM | POA: Diagnosis not present

## 2019-12-08 DIAGNOSIS — D2262 Melanocytic nevi of left upper limb, including shoulder: Secondary | ICD-10-CM | POA: Diagnosis not present

## 2019-12-08 DIAGNOSIS — D2221 Melanocytic nevi of right ear and external auricular canal: Secondary | ICD-10-CM | POA: Diagnosis not present

## 2019-12-08 DIAGNOSIS — L821 Other seborrheic keratosis: Secondary | ICD-10-CM | POA: Diagnosis not present

## 2019-12-15 DIAGNOSIS — Z23 Encounter for immunization: Secondary | ICD-10-CM | POA: Diagnosis not present

## 2020-01-12 DIAGNOSIS — Z20822 Contact with and (suspected) exposure to covid-19: Secondary | ICD-10-CM | POA: Diagnosis not present

## 2020-03-14 DIAGNOSIS — K5900 Constipation, unspecified: Secondary | ICD-10-CM | POA: Diagnosis not present

## 2020-03-14 DIAGNOSIS — R1012 Left upper quadrant pain: Secondary | ICD-10-CM | POA: Diagnosis not present

## 2020-03-14 DIAGNOSIS — R140 Abdominal distension (gaseous): Secondary | ICD-10-CM | POA: Diagnosis not present

## 2020-03-14 DIAGNOSIS — K625 Hemorrhage of anus and rectum: Secondary | ICD-10-CM | POA: Diagnosis not present

## 2020-04-06 DIAGNOSIS — Z1152 Encounter for screening for COVID-19: Secondary | ICD-10-CM | POA: Diagnosis not present

## 2020-12-13 DIAGNOSIS — D2271 Melanocytic nevi of right lower limb, including hip: Secondary | ICD-10-CM | POA: Diagnosis not present

## 2020-12-13 DIAGNOSIS — D225 Melanocytic nevi of trunk: Secondary | ICD-10-CM | POA: Diagnosis not present

## 2020-12-13 DIAGNOSIS — D485 Neoplasm of uncertain behavior of skin: Secondary | ICD-10-CM | POA: Diagnosis not present

## 2020-12-13 DIAGNOSIS — D2272 Melanocytic nevi of left lower limb, including hip: Secondary | ICD-10-CM | POA: Diagnosis not present

## 2020-12-13 DIAGNOSIS — D2262 Melanocytic nevi of left upper limb, including shoulder: Secondary | ICD-10-CM | POA: Diagnosis not present

## 2020-12-13 DIAGNOSIS — D224 Melanocytic nevi of scalp and neck: Secondary | ICD-10-CM | POA: Diagnosis not present

## 2020-12-13 DIAGNOSIS — L2089 Other atopic dermatitis: Secondary | ICD-10-CM | POA: Diagnosis not present

## 2020-12-13 DIAGNOSIS — D2221 Melanocytic nevi of right ear and external auricular canal: Secondary | ICD-10-CM | POA: Diagnosis not present

## 2020-12-13 DIAGNOSIS — D2261 Melanocytic nevi of right upper limb, including shoulder: Secondary | ICD-10-CM | POA: Diagnosis not present

## 2021-01-08 DIAGNOSIS — R070 Pain in throat: Secondary | ICD-10-CM | POA: Diagnosis not present

## 2021-01-08 DIAGNOSIS — S2242XA Multiple fractures of ribs, left side, initial encounter for closed fracture: Secondary | ICD-10-CM | POA: Diagnosis not present

## 2021-01-17 DIAGNOSIS — R0781 Pleurodynia: Secondary | ICD-10-CM | POA: Diagnosis not present

## 2021-01-17 DIAGNOSIS — T148XXA Other injury of unspecified body region, initial encounter: Secondary | ICD-10-CM | POA: Diagnosis not present

## 2021-01-25 DIAGNOSIS — R0781 Pleurodynia: Secondary | ICD-10-CM | POA: Diagnosis not present

## 2021-01-25 DIAGNOSIS — Z6822 Body mass index (BMI) 22.0-22.9, adult: Secondary | ICD-10-CM | POA: Diagnosis not present

## 2021-01-25 DIAGNOSIS — S46812A Strain of other muscles, fascia and tendons at shoulder and upper arm level, left arm, initial encounter: Secondary | ICD-10-CM | POA: Diagnosis not present

## 2021-01-30 DIAGNOSIS — K5120 Ulcerative (chronic) proctitis without complications: Secondary | ICD-10-CM | POA: Diagnosis not present

## 2021-01-30 DIAGNOSIS — Z8371 Family history of colonic polyps: Secondary | ICD-10-CM | POA: Diagnosis not present

## 2021-02-02 ENCOUNTER — Ambulatory Visit
Admission: RE | Admit: 2021-02-02 | Discharge: 2021-02-02 | Disposition: A | Discharge status: Home / Self Care | Payer: 59 | Source: Ambulatory Visit | Attending: Family Medicine | Admitting: Family Medicine

## 2021-02-02 ENCOUNTER — Other Ambulatory Visit: Payer: Self-pay

## 2021-02-02 ENCOUNTER — Other Ambulatory Visit: Payer: Self-pay | Admitting: Family Medicine

## 2021-02-02 DIAGNOSIS — R0781 Pleurodynia: Secondary | ICD-10-CM

## 2021-02-02 IMAGING — CR DG RIBS W/ CHEST 3+V*L*
5 series · 5 of 5 positions shown · non-contrast
Comparison: None.

CLINICAL DATA: Left rib pain.

EXAM:
LEFT RIBS AND CHEST - 3+ VIEW

[w chest pa]
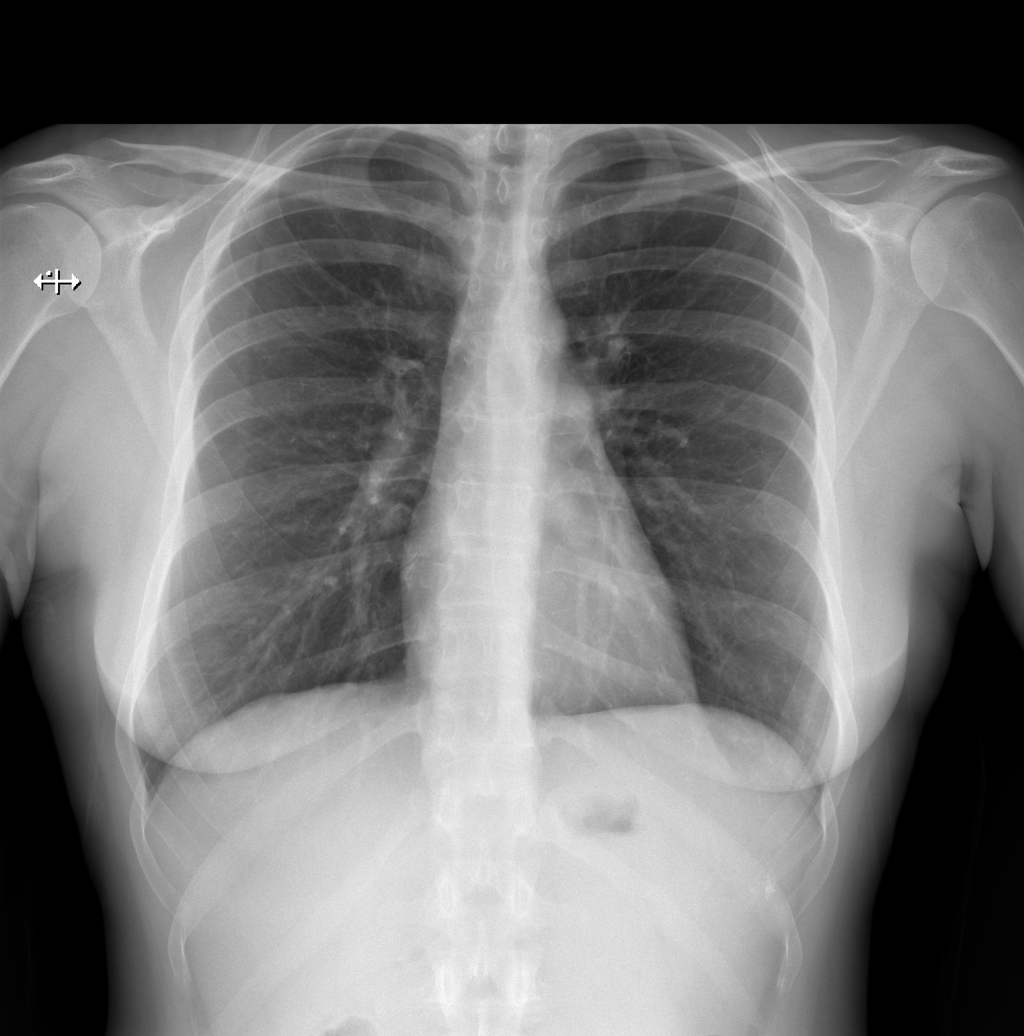

[w ribs ap upper left]
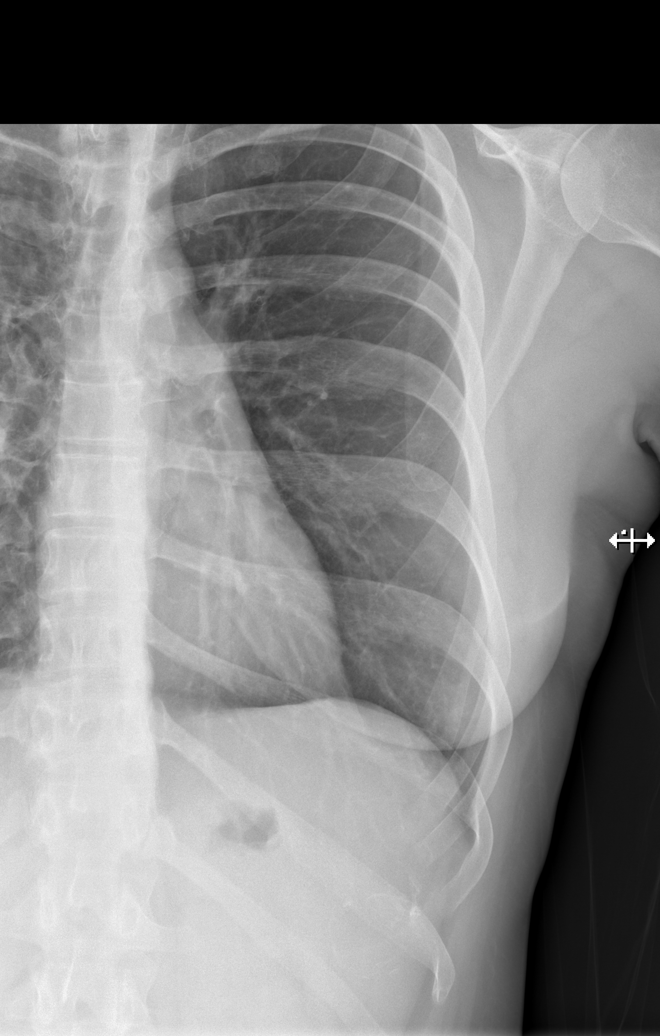

[w ribs ap lower left]
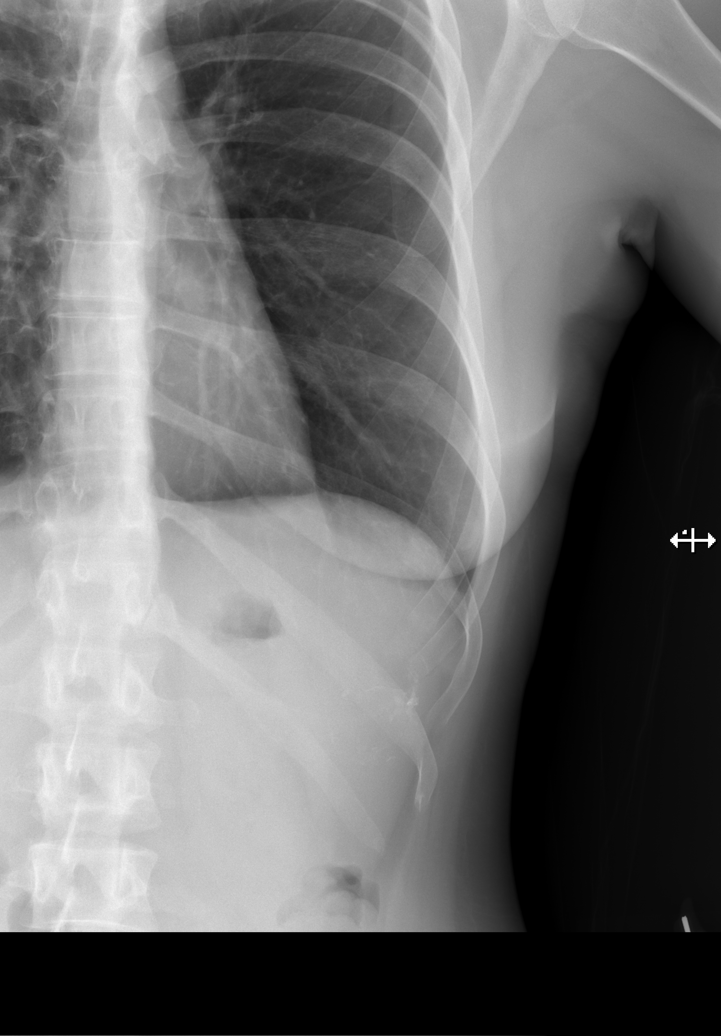

[w ribs obl left (1 of 2)]
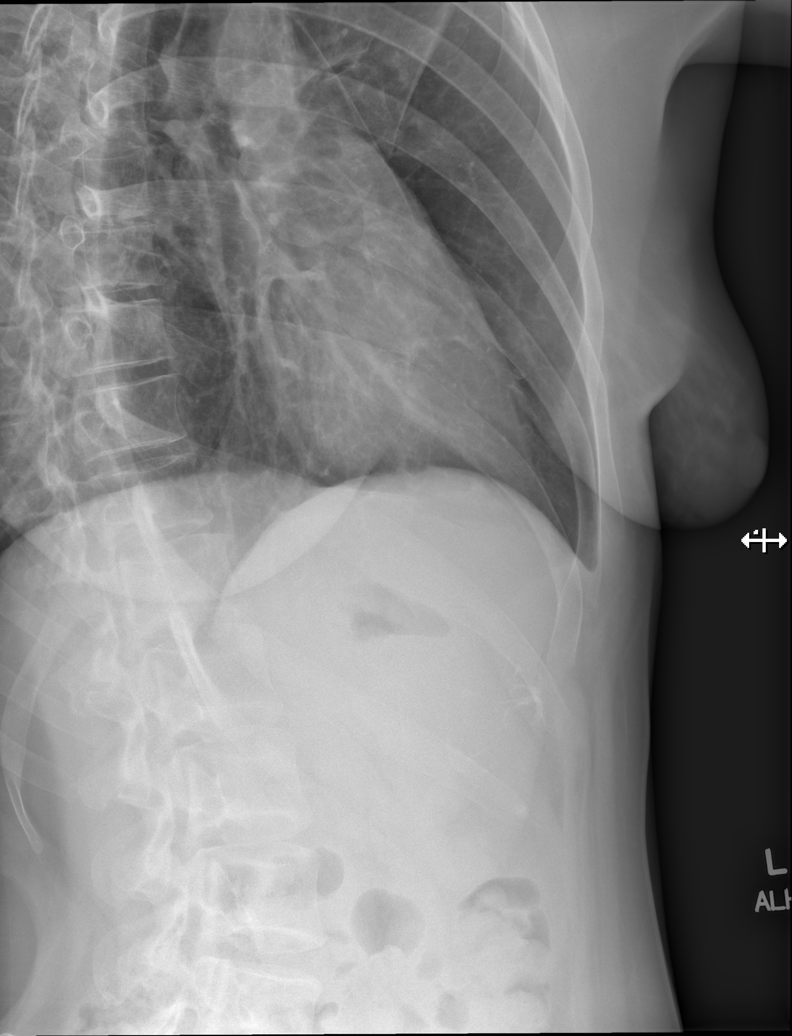

[w ribs obl left (2 of 2)]
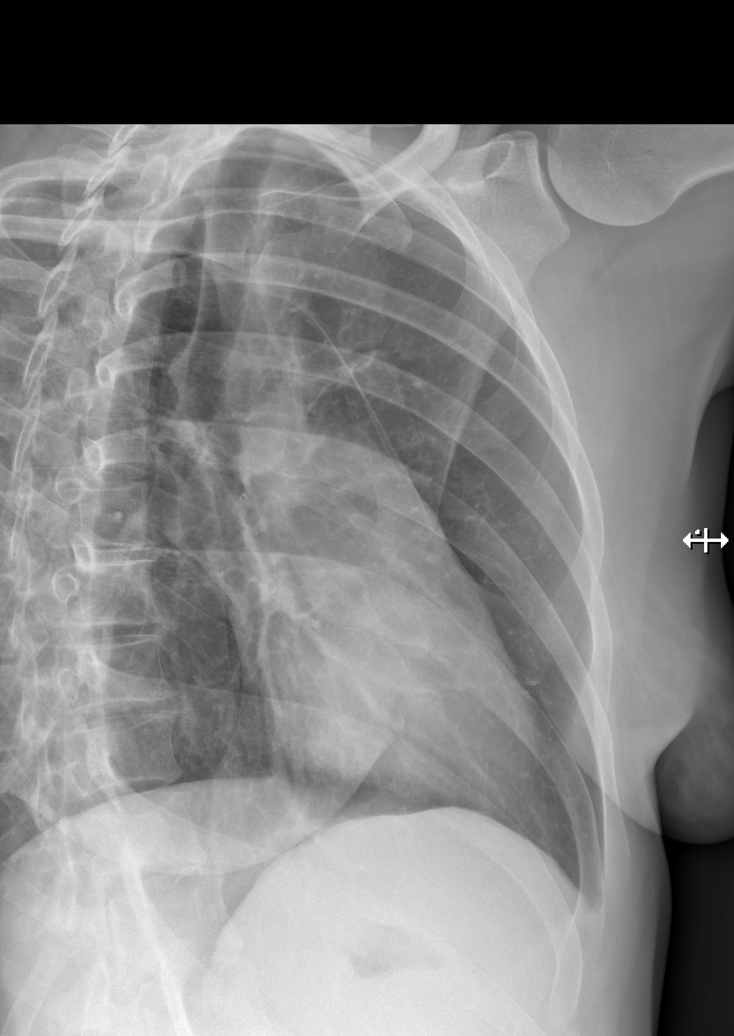

[5 of 5 positions shown; findings below may reference images not displayed]

FINDINGS: Both lungs are clear. Negative for a pneumothorax. Heart and
mediastinum are within normal limits. Trachea is midline. Bony
thorax is intact. Negative for a displaced left rib fracture. Few
calcifications in the left upper abdomen are nonspecific.
IMPRESSION: No acute abnormality.

## 2021-02-06 ENCOUNTER — Other Ambulatory Visit: Payer: Self-pay | Admitting: Family Medicine

## 2021-02-06 DIAGNOSIS — R0781 Pleurodynia: Secondary | ICD-10-CM

## 2021-02-28 ENCOUNTER — Other Ambulatory Visit: Payer: Self-pay | Admitting: Family Medicine

## 2021-02-28 ENCOUNTER — Ambulatory Visit
Admission: RE | Admit: 2021-02-28 | Discharge: 2021-02-28 | Disposition: A | Discharge status: Home / Self Care | Payer: 59 | Source: Ambulatory Visit | Attending: Family Medicine | Admitting: Family Medicine

## 2021-02-28 DIAGNOSIS — R0781 Pleurodynia: Secondary | ICD-10-CM

## 2021-02-28 DIAGNOSIS — D734 Cyst of spleen: Secondary | ICD-10-CM | POA: Diagnosis not present

## 2021-02-28 DIAGNOSIS — R0782 Intercostal pain: Secondary | ICD-10-CM | POA: Diagnosis not present

## 2021-02-28 DIAGNOSIS — D7389 Other diseases of spleen: Secondary | ICD-10-CM | POA: Diagnosis not present

## 2021-02-28 IMAGING — CT CT ABDOMEN W/O CM
2 series · 13 of 32 positions shown, 19 images · non-contrast
Comparison: Rib series [DATE]

CLINICAL DATA: Left lower rib pain for 4 months

EXAM:
CT CHEST AND ABDOMEN WITHOUT CONTRAST
TECHNIQUE: Multidetector CT imaging of the chest and abdomen was performed
following the standard protocol without intravenous contrast.

[Series 2: chest/abd/pelvis w/(date) · axial · 0.65mm/px · z∈[-418,-58]mm · 10 of 90 slices shown, 16 images]
[im 9/90  soft-tissue]
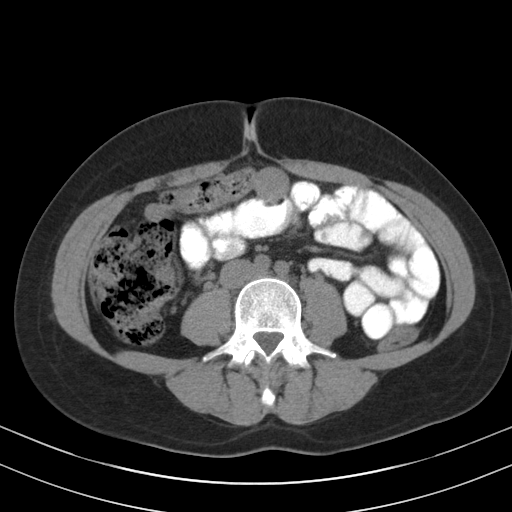
[im 9/90  bone]
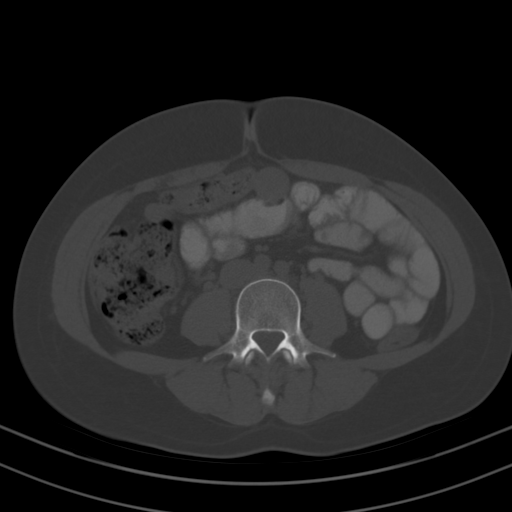
[im 17/90  soft-tissue]
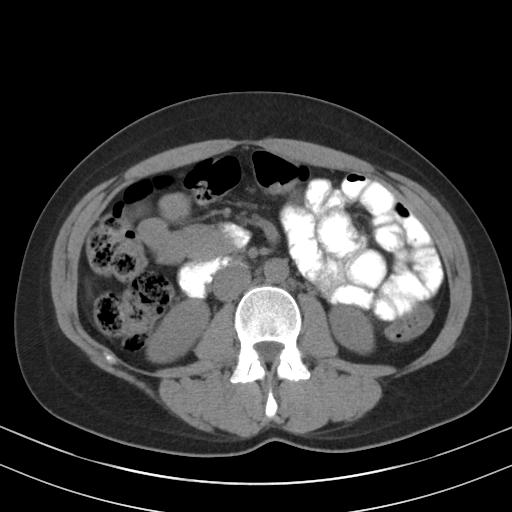
[im 25/90  soft-tissue]
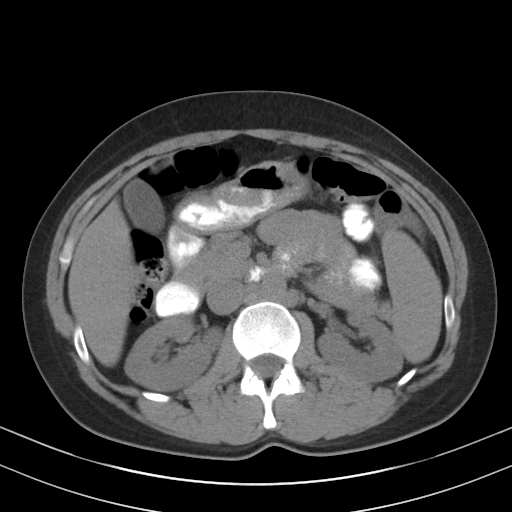
[im 33/90  soft-tissue]
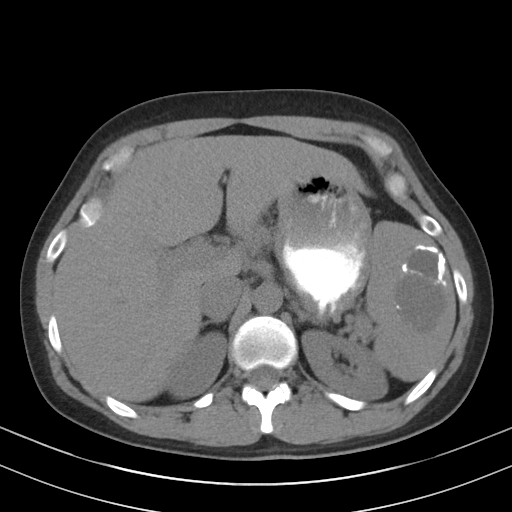
[im 41/90  soft-tissue]
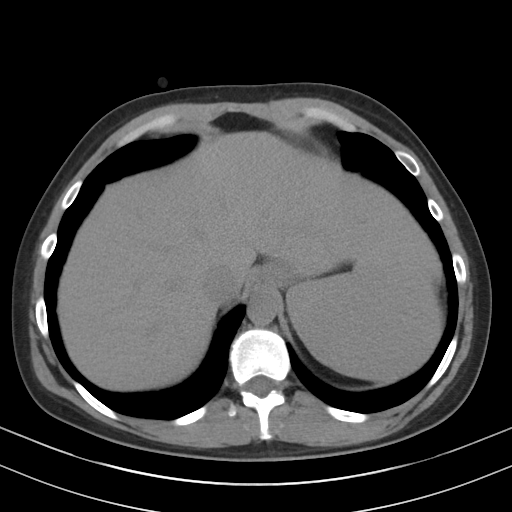
[im 49/90  soft-tissue]
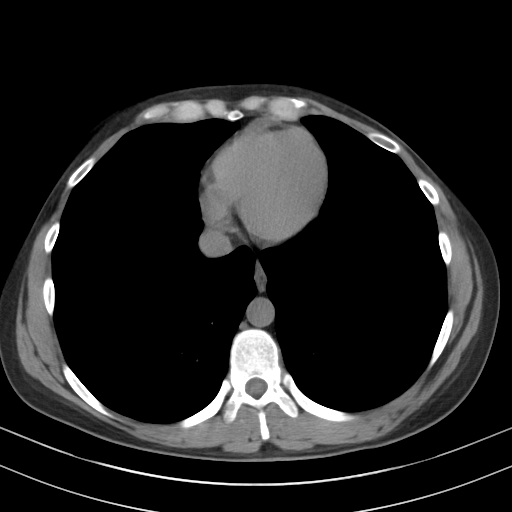
[im 57/90  soft-tissue]
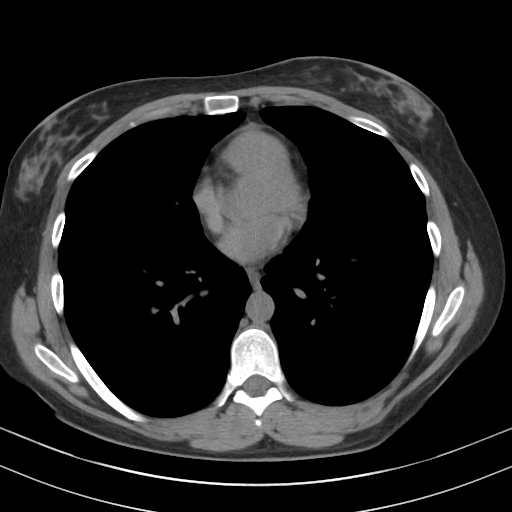
[im 57/90  lung]
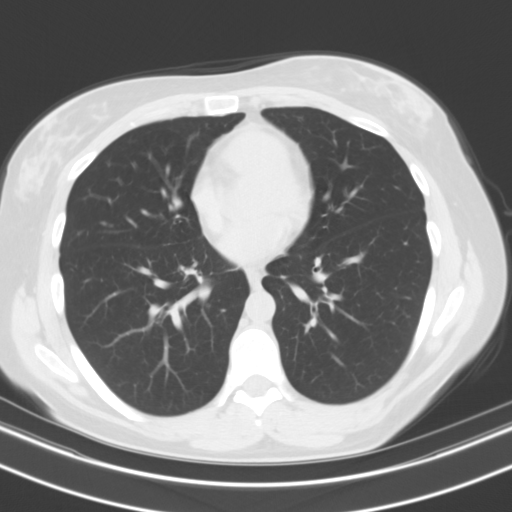
[im 65/90  soft-tissue]
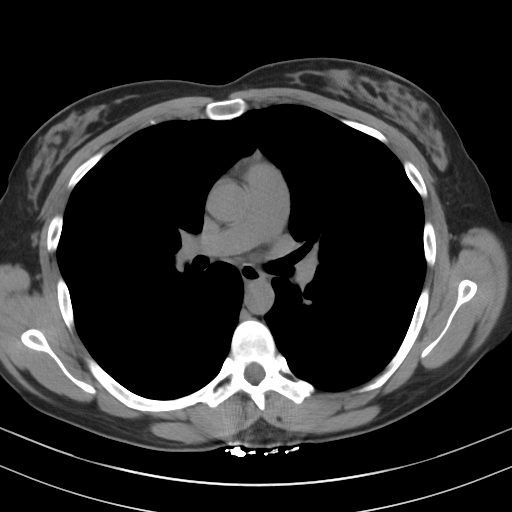
[im 65/90  lung]
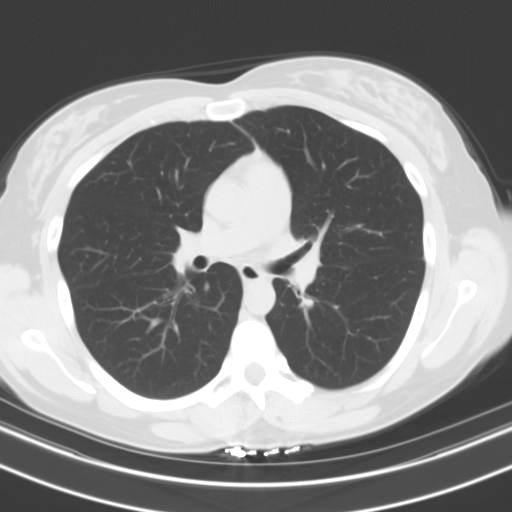
[im 73/90  soft-tissue]
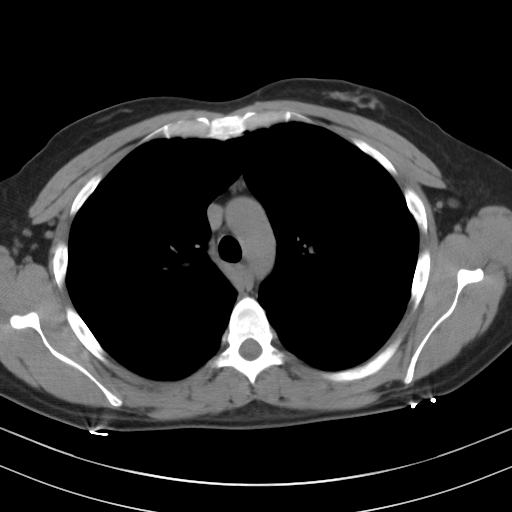
[im 73/90  lung]
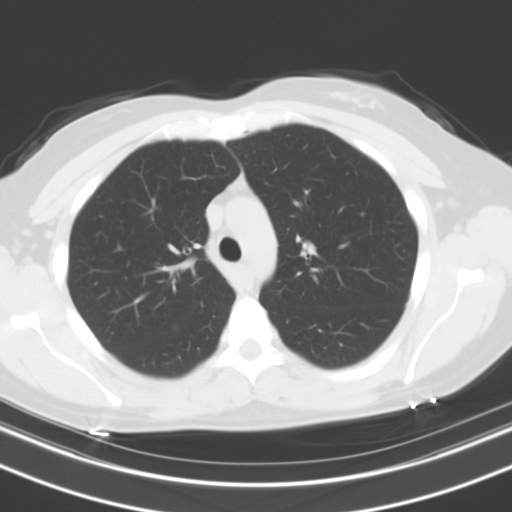
[im 73/90  bone]
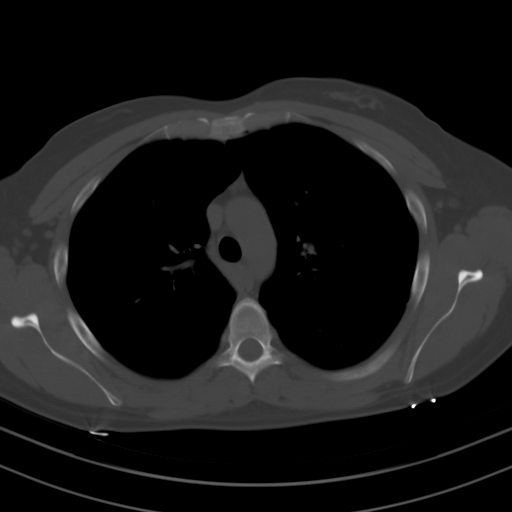
[im 81/90  soft-tissue]
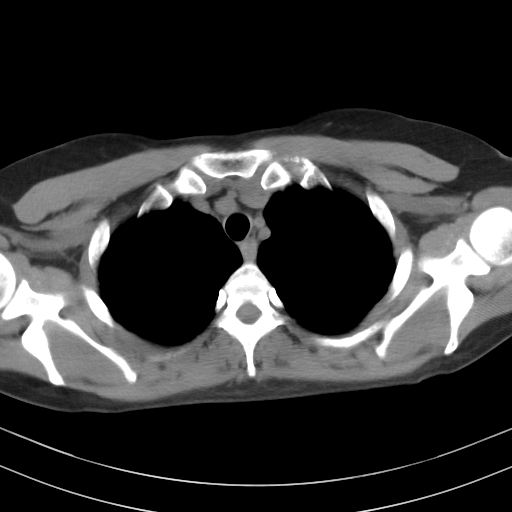
[im 81/90  lung]
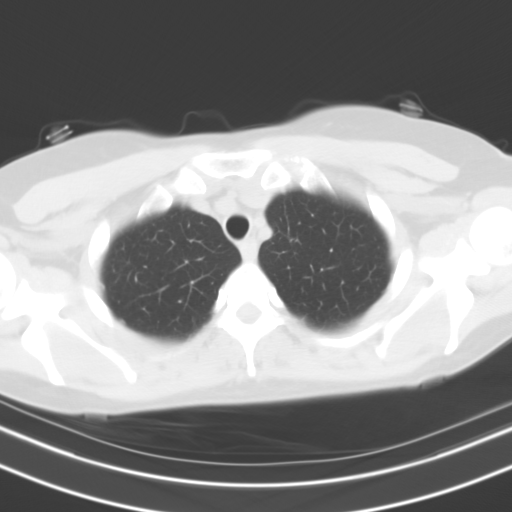

[Series 6: lung · axial · 0.65mm/px · z∈[-274,-214]mm · 3 of 146 slices shown]
[im 16/146  bone]
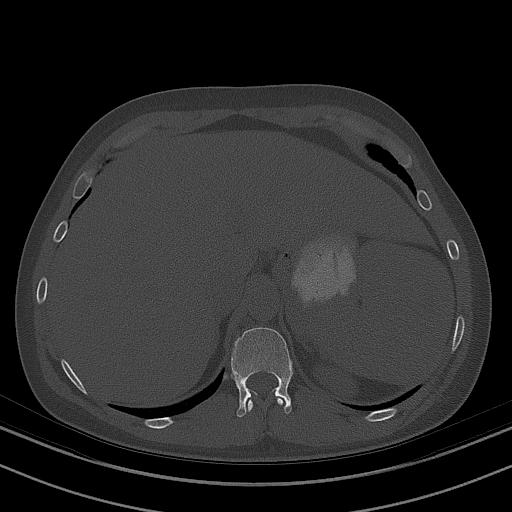
[im 31/146  bone]
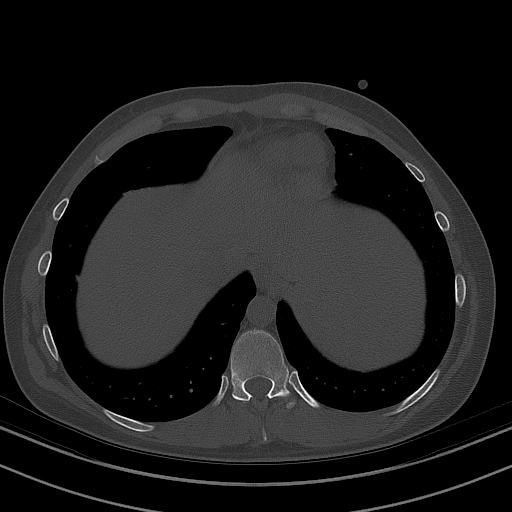
[im 46/146  bone]
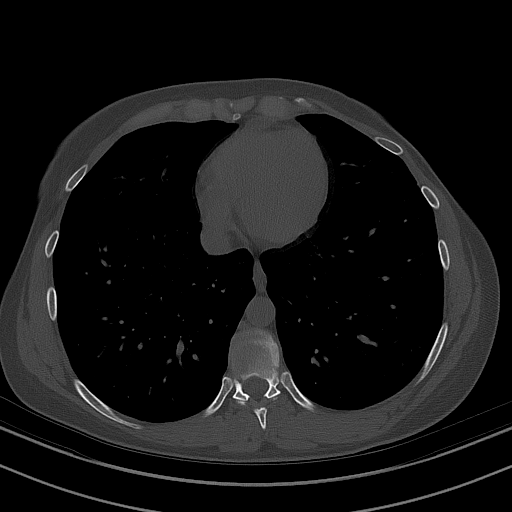

[13 of 32 positions shown; findings below may reference images not displayed]

FINDINGS: CT CHEST FINDINGS

Cardiovascular: Heart is normal size. Aorta is normal caliber.

Mediastinum/Nodes: No mediastinal, hilar, or axillary adenopathy.
Trachea and esophagus are unremarkable. Thyroid unremarkable. Wispy
soft tissue in the anterior mediastinum felt to reflect residual
thymus.

Lungs/Pleura: Lungs are clear. No focal airspace opacities or
suspicious nodules. No effusions.

Musculoskeletal: No acute bony abnormality. No visible rib fracture
or focal rib lesion.

CT ABDOMEN FINDINGS

Hepatobiliary: No focal hepatic abnormality. Gallbladder
unremarkable.

Pancreas: No focal abnormality or ductal dilatation.

Spleen: Spleen is normal size. Septated rim calcified cystic lesion
seen within the spleen measuring 5.5 x 3.4 cm. This could reflect
epidermoid cyst or hydatid cyst.

Adrenals/Urinary Tract: No renal or ureteral stones. Adrenal glands
unremarkable. No hydronephrosis.

Stomach/Bowel: Stomach, visualized large and small bowel
unremarkable.

Vascular/Lymphatic: No evidence of aneurysm or adenopathy.

Other: No free fluid or free air.

Musculoskeletal: No acute bony abnormality.
IMPRESSION: No evidence of rib fracture.

No acute cardiopulmonary disease.

5.5 cm septated cyst in the spleen with rim calcifications, could
reflect epidermoid cyst or hydatid cyst.

No acute findings in the abdomen.

## 2021-02-28 IMAGING — CT CT CHEST W/O CM
2 series · 13 of 32 positions shown, 19 images · non-contrast
Comparison: Rib series [DATE]

CLINICAL DATA: Left lower rib pain for 4 months

EXAM:
CT CHEST AND ABDOMEN WITHOUT CONTRAST
TECHNIQUE: Multidetector CT imaging of the chest and abdomen was performed
following the standard protocol without intravenous contrast.

[Series 2: chest/abd/pelvis w/(date) · axial · 0.65mm/px · z∈[-418,-58]mm · 10 of 90 slices shown, 16 images]
[im 9/90  soft-tissue]
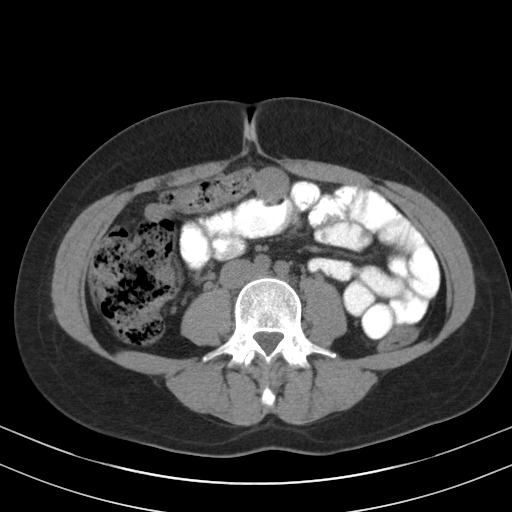
[im 9/90  bone]
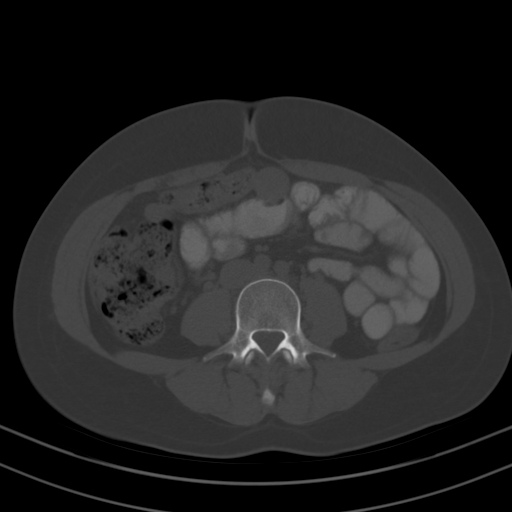
[im 17/90  soft-tissue]
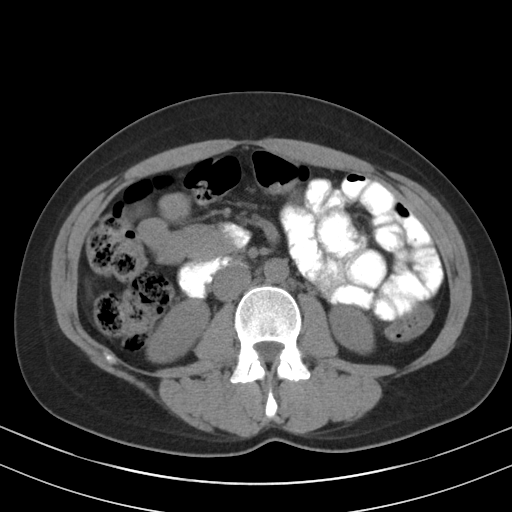
[im 25/90  soft-tissue]
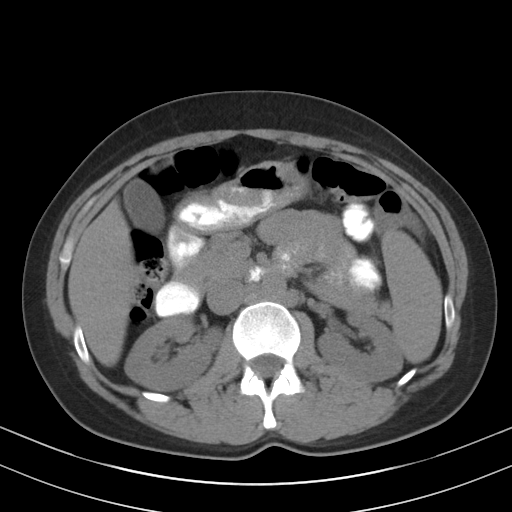
[im 33/90  soft-tissue]
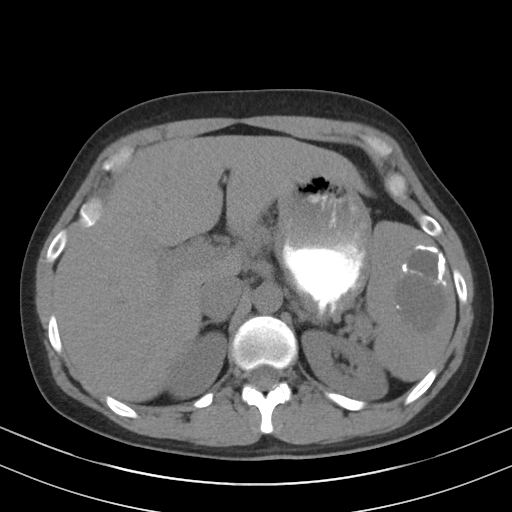
[im 41/90  soft-tissue]
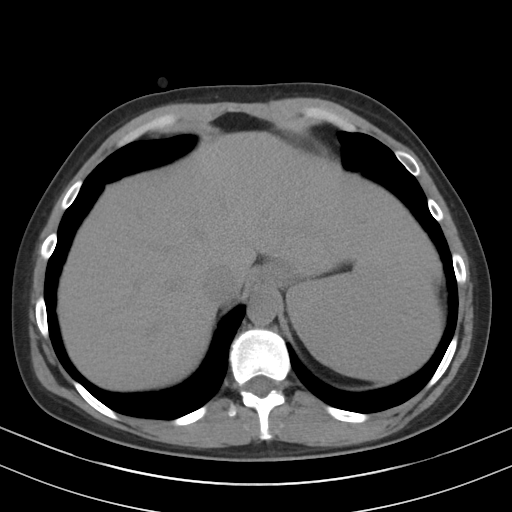
[im 49/90  soft-tissue]
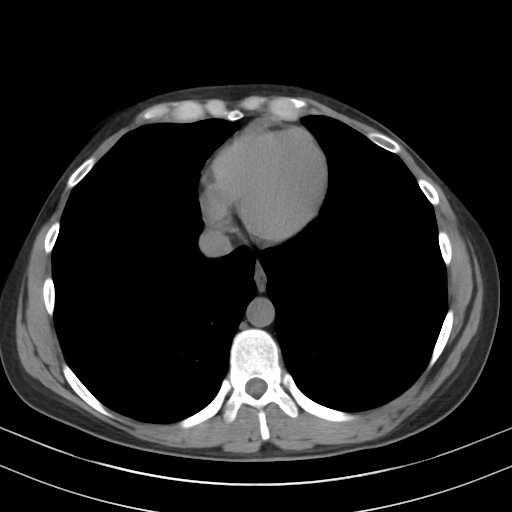
[im 57/90  soft-tissue]
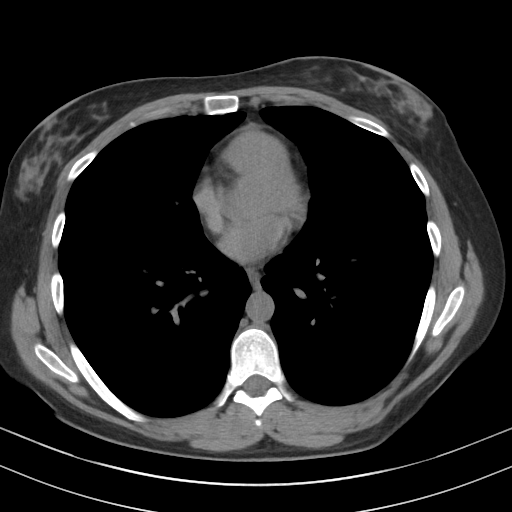
[im 57/90  lung]
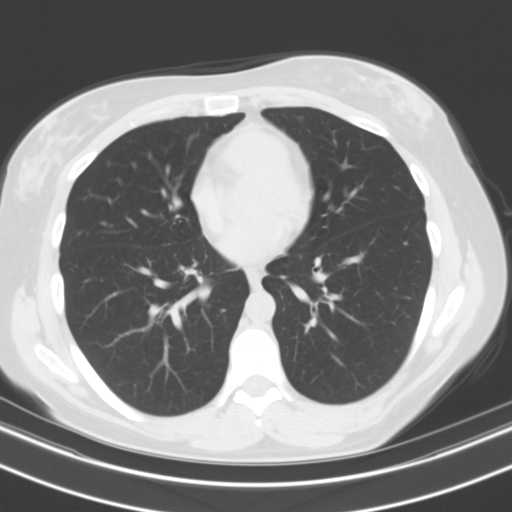
[im 65/90  soft-tissue]
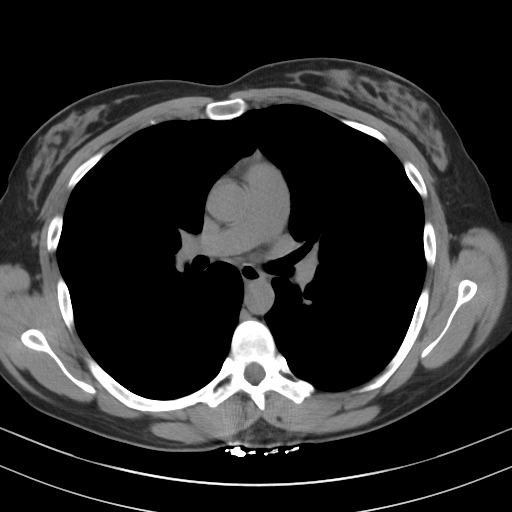
[im 65/90  lung]
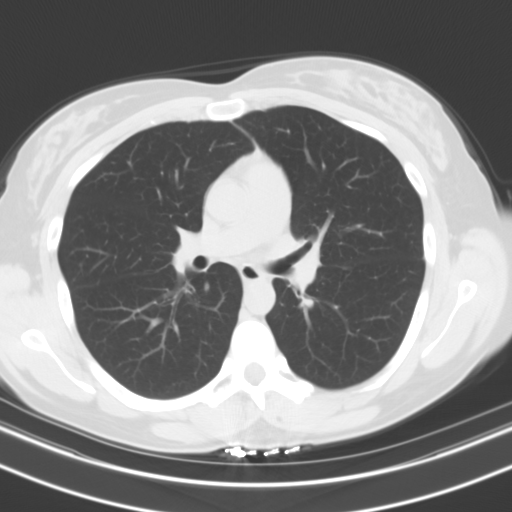
[im 73/90  soft-tissue]
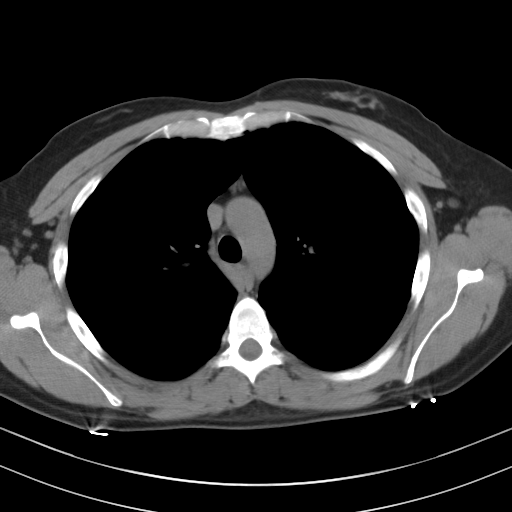
[im 73/90  lung]
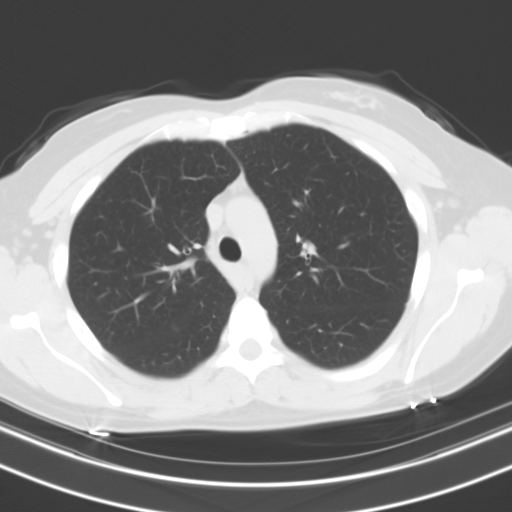
[im 73/90  bone]
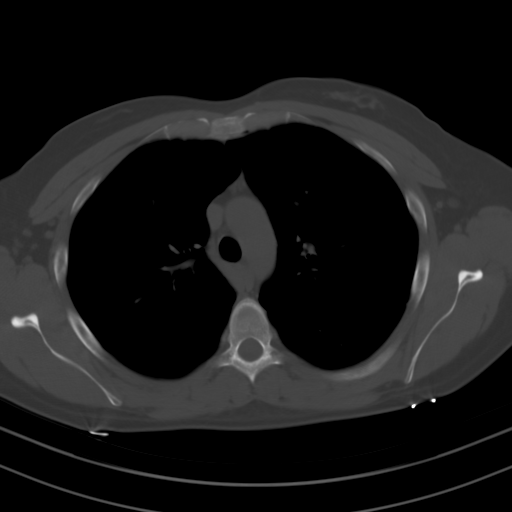
[im 81/90  soft-tissue]
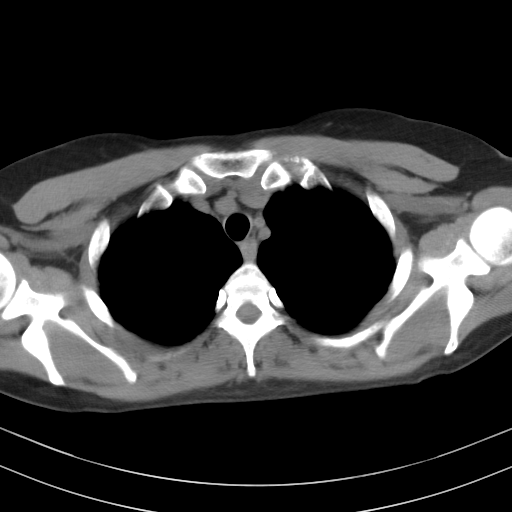
[im 81/90  lung]
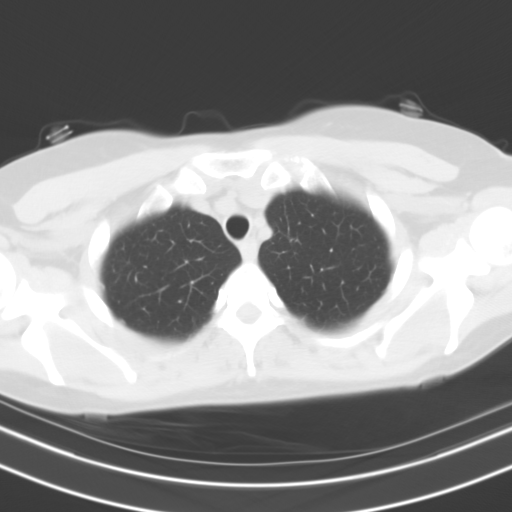

[Series 6: lung · axial · 0.65mm/px · z∈[-274,-214]mm · 3 of 146 slices shown]
[im 16/146  bone]
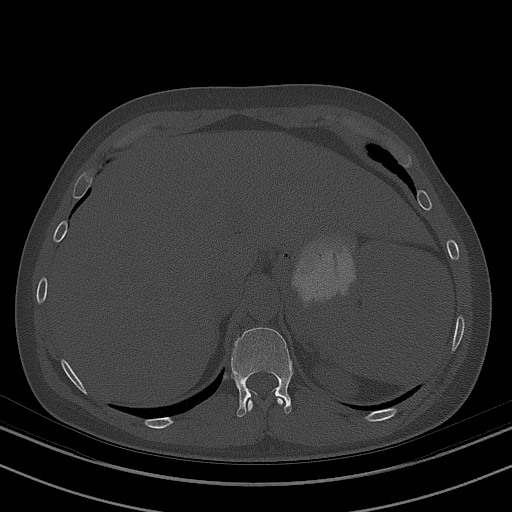
[im 31/146  bone]
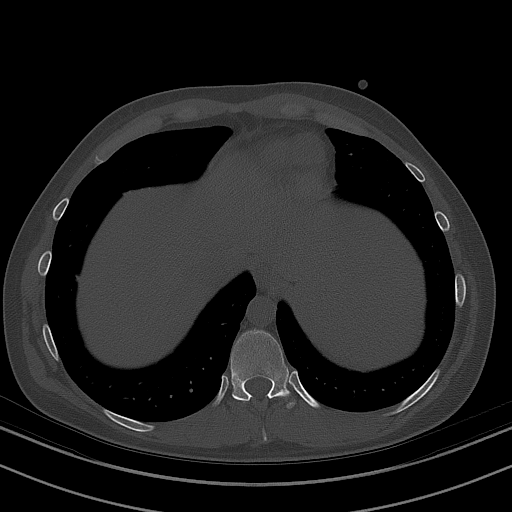
[im 46/146  bone]
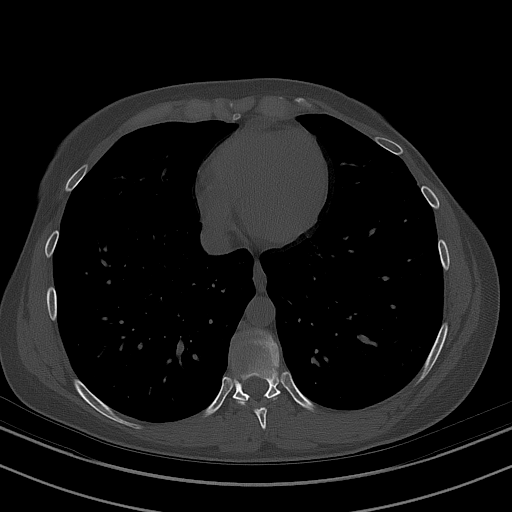

[13 of 32 positions shown; findings below may reference images not displayed]

FINDINGS: CT CHEST FINDINGS

Cardiovascular: Heart is normal size. Aorta is normal caliber.

Mediastinum/Nodes: No mediastinal, hilar, or axillary adenopathy.
Trachea and esophagus are unremarkable. Thyroid unremarkable. Wispy
soft tissue in the anterior mediastinum felt to reflect residual
thymus.

Lungs/Pleura: Lungs are clear. No focal airspace opacities or
suspicious nodules. No effusions.

Musculoskeletal: No acute bony abnormality. No visible rib fracture
or focal rib lesion.

CT ABDOMEN FINDINGS

Hepatobiliary: No focal hepatic abnormality. Gallbladder
unremarkable.

Pancreas: No focal abnormality or ductal dilatation.

Spleen: Spleen is normal size. Septated rim calcified cystic lesion
seen within the spleen measuring 5.5 x 3.4 cm. This could reflect
epidermoid cyst or hydatid cyst.

Adrenals/Urinary Tract: No renal or ureteral stones. Adrenal glands
unremarkable. No hydronephrosis.

Stomach/Bowel: Stomach, visualized large and small bowel
unremarkable.

Vascular/Lymphatic: No evidence of aneurysm or adenopathy.

Other: No free fluid or free air.

Musculoskeletal: No acute bony abnormality.
IMPRESSION: No evidence of rib fracture.

No acute cardiopulmonary disease.

5.5 cm septated cyst in the spleen with rim calcifications, could
reflect epidermoid cyst or hydatid cyst.

No acute findings in the abdomen.

## 2021-03-07 DIAGNOSIS — D734 Cyst of spleen: Secondary | ICD-10-CM | POA: Diagnosis not present

## 2021-03-08 DIAGNOSIS — F419 Anxiety disorder, unspecified: Secondary | ICD-10-CM | POA: Diagnosis not present

## 2021-03-08 DIAGNOSIS — R0781 Pleurodynia: Secondary | ICD-10-CM | POA: Diagnosis not present

## 2021-03-08 DIAGNOSIS — D734 Cyst of spleen: Secondary | ICD-10-CM | POA: Diagnosis not present

## 2021-03-08 DIAGNOSIS — Z23 Encounter for immunization: Secondary | ICD-10-CM | POA: Diagnosis not present

## 2021-03-08 DIAGNOSIS — Z6822 Body mass index (BMI) 22.0-22.9, adult: Secondary | ICD-10-CM | POA: Diagnosis not present

## 2021-03-13 DIAGNOSIS — Z23 Encounter for immunization: Secondary | ICD-10-CM | POA: Diagnosis not present

## 2021-03-14 DIAGNOSIS — Z01419 Encounter for gynecological examination (general) (routine) without abnormal findings: Secondary | ICD-10-CM | POA: Diagnosis not present

## 2021-03-14 DIAGNOSIS — Z124 Encounter for screening for malignant neoplasm of cervix: Secondary | ICD-10-CM | POA: Diagnosis not present

## 2021-04-18 ENCOUNTER — Ambulatory Visit: Payer: Self-pay | Admitting: General Surgery

## 2021-05-03 NOTE — Patient Instructions (Addendum)
DUE TO COVID-19 ONLY ONE VISITOR IS ALLOWED TO COME WITH YOU AND STAY IN THE WAITING ROOM ONLY DURING PRE OP AND PROCEDURE.   **NO VISITORS ARE ALLOWED IN THE SHORT STAY AREA OR RECOVERY ROOM!!**  IF YOU WILL BE ADMITTED INTO THE HOSPITAL YOU ARE ALLOWED ONLY TWO SUPPORT PEOPLE DURING VISITATION HOURS ONLY (7 AM -8PM)    Up to two visitors ages 35+ are allowed at one time in a patient's room.  The visitors may rotate out with other people throughout the day.  Additionally, up to two children between the ages of 53 and 70 are allowed and do not count toward the number of allowed visitors.  Children within this age range must be accompanied by an adult visitor.  One adult visitor may remain with the patient overnight and must be in the room by 8 PM.  COVID SWAB TESTING MUST BE COMPLETED ON: 05-16-21 @  8:15 AM   COME IN THROUGH MAIN ENTRANCE of [facility].  Take a seat in the lobby area to the right as you come in the main entrance.  Call [phone]  and give your name and let them know you are here for COVID testing  You are not required to quarantine, however you are required to wear a well-fitted mask when you are out and around people not in your household.  Hand Hygiene often Do NOT share personal items Notify your provider if you are in close contact with someone who has COVID or you develop fever 100.4 or greater, new onset of sneezing, cough, sore throat, shortness of breath or body aches.        Your procedure is scheduled on: Friday, 05-18-21   Report to [facility] Main  Entrance     Report to admitting at 5:15 AM   Call this number if you have problems the morning of surgery [phone]   Do not eat food :After Midnight.   May have liquids until 4:30 AM day of surgery  CLEAR LIQUID DIET  Foods Allowed                                                                     Foods Excluded  Water, Black Coffee (no milk/no creamer) and tea, regular and decaf                               liquids that you cannot  Plain Jell-O in any flavor  (No red)                         see through such as: Fruit ices (not with fruit pulp)                                 milk, soups, orange juice  Iced Popsicles (No red)                                    All solid food  Apple juices Sports drinks like Gatorade (No red) Lightly seasoned clear broth or consume(fat free) Sugar    Complete one Ensure drink the morning of surgery at  4:30 AM the day of surgery.       The day of surgery:  Drink ONE (1) Pre-Surgery Clear Ensure  the morning of surgery. Drink in one sitting. Do not sip.  This drink was given to you during your hospital  pre-op appointment visit. Nothing else to drink after completing the Pre-Surgery Clear Ensure         If you have questions, please contact your surgeons office.     Oral Hygiene is also important to reduce your risk of infection.                                    Remember - BRUSH YOUR TEETH THE MORNING OF SURGERY WITH YOUR REGULAR TOOTHPASTE   Do NOT smoke after Midnight   Take these medicines the morning of surgery with A SIP OF WATER: None   Stop all vitamins and herbal supplements a week before surgery.     Stop Motrin, Aleve, Ibuprofen a week before surgery.             You may not have any metal on your body including hair pins, jewelry, and body piercing             Do not wear make-up, lotions, powders, perfumes or deodorant  Do not wear nail polish including gel and S&S, artificial/acrylic nails, or any other type of covering on natural nails including finger and toenails. If you have artificial nails, gel coating, etc. that needs to be removed by a nail salon please have this removed prior to surgery or surgery may need to be canceled/ delayed if the surgeon/ anesthesia feels like they are unable to be safely monitored.   Do not shave  48 hours prior to surgery.    Contacts, dentures or  bridgework may not be worn into surgery.  Bring small overnight bag day of surgery.  Do not bring valuables to the hospital. [place].   Please read over the following fact sheets you were given: IF YOU HAVE QUESTIONS ABOUT YOUR PRE OP INSTRUCTIONS PLEASE CALL [facility] - Preparing for Surgery Before surgery, you can play an important role.  Because skin is not sterile, your skin needs to be as free of germs as possible.  You can reduce the number of germs on your skin by washing with CHG (chlorahexidine gluconate) soap before surgery.  CHG is an antiseptic cleaner which kills germs and bonds with the skin to continue killing germs even after washing. Please DO NOT use if you have an allergy to CHG or antibacterial soaps.  If your skin becomes reddened/irritated stop using the CHG and inform your nurse when you arrive at Short Stay. Do not shave (including legs and underarms) for at least 48 hours prior to the first CHG shower.  You may shave your face/neck.  Please follow these instructions carefully:  1.  Shower with CHG Soap the night before surgery and the  morning of surgery.  2.  If you choose to wash your hair, wash your hair first as usual with your normal  shampoo.  3.  After you shampoo, rinse your hair and body thoroughly to remove the shampoo.  4.  Use CHG as you would any other liquid soap.  You can apply chg directly to the skin and wash.  Gently with a scrungie or clean washcloth.  5.  Apply the CHG Soap to your body ONLY FROM THE NECK DOWN.   Do   not use on face/ open                           Wound or open sores. Avoid contact with eyes, ears mouth and   genitals (private parts).                       Wash face,  Genitals (private parts) with your normal soap.             6.  Wash thoroughly, paying special attention to the area where your    surgery  will be performed.  7.  Thoroughly rinse  your body with warm water from the neck down.  8.  DO NOT shower/wash with your normal soap after using and rinsing off the CHG Soap.                9.  Pat yourself dry with a clean towel.            10.  Wear clean pajamas.            11.  Place clean sheets on your bed the night of your first shower and do not  sleep with pets. Day of Surgery : Do not apply any lotions/deodorants the morning of surgery.  Please wear clean clothes to the hospital/surgery center.  FAILURE TO FOLLOW THESE INSTRUCTIONS MAY RESULT IN THE CANCELLATION OF YOUR SURGERY  PATIENT SIGNATURE_________________________________  NURSE SIGNATURE__________________________________  ________________________________________________________________________

## 2021-05-09 ENCOUNTER — Encounter (HOSPITAL_COMMUNITY): Payer: Self-pay

## 2021-05-09 ENCOUNTER — Encounter (HOSPITAL_COMMUNITY)
Admission: RE | Admit: 2021-05-09 | Discharge: 2021-05-09 | Disposition: A | Discharge status: Home / Self Care | Payer: 59 | Source: Ambulatory Visit | Attending: General Surgery | Admitting: General Surgery

## 2021-05-09 ENCOUNTER — Other Ambulatory Visit: Payer: Self-pay

## 2021-05-09 VITALS — BP 136/89 | HR 80 | Temp 98.0°F | Resp 18 | Ht 70.0 in | Wt 151.6 lb

## 2021-05-09 DIAGNOSIS — Z01818 Encounter for other preprocedural examination: Secondary | ICD-10-CM

## 2021-05-09 DIAGNOSIS — Z01812 Encounter for preprocedural laboratory examination: Secondary | ICD-10-CM | POA: Insufficient documentation

## 2021-05-09 HISTORY — DX: Gastro-esophageal reflux disease without esophagitis: K21.9

## 2021-05-09 HISTORY — DX: Anxiety disorder, unspecified: F41.9

## 2021-05-09 LAB — CBC
HCT: 40.9 % (ref 36.0–46.0)
Hemoglobin: 14 g/dL (ref 12.0–15.0)
MCH: 30.9 pg (ref 26.0–34.0)
MCHC: 34.2 g/dL (ref 30.0–36.0)
MCV: 90.3 fL (ref 80.0–100.0)
Platelets: 240 10*3/uL (ref 150–400)
RBC: 4.53 MIL/uL (ref 3.87–5.11)
RDW: 12.6 % (ref 11.5–15.5)
WBC: 4.4 10*3/uL (ref 4.0–10.5)
nRBC: 0 % (ref 0.0–0.2)

## 2021-05-09 NOTE — Progress Notes (Signed)
COVID swab appointment: 05-16-21 @  COVID Vaccine Completed:  Yes x2 Date COVID Vaccine completed: Has received booster: COVID vaccine manufacturer: [employer]   Date of COVID positive in last 90 days:  No  PCP - Faustino Congress, NP Cardiologist - N/A  Chest x-ray - N/A EKG - N/A Stress Test - N/A ECHO - N/A Cardiac Cath - N/A Pacemaker/ICD device last checked: Spinal Cord Stimulator:  Bowel Prep - N/A  Sleep Study - N/A CPAP -   Fasting Blood Sugar - N/A Checks Blood Sugar _____ times a day  Blood Thinner Instructions:N/A Aspirin Instructions: Last Dose:  Activity level:   Can go up a flight of stairs and perform activities of daily living without stopping and without symptoms of chest pain or shortness of breath.  Able to exercise without symptoms   Anesthesia review: N/A  Patient denies shortness of breath, fever, cough and chest pain at PAT appointment  Patient verbalized understanding of instructions that were given to them at the PAT appointment. Patient was also instructed that they will need to review over the PAT instructions again at home before surgery.

## 2021-05-15 DIAGNOSIS — D734 Cyst of spleen: Secondary | ICD-10-CM | POA: Diagnosis not present

## 2021-05-15 DIAGNOSIS — D7389 Other diseases of spleen: Secondary | ICD-10-CM | POA: Diagnosis not present

## 2021-05-15 DIAGNOSIS — Z20822 Contact with and (suspected) exposure to covid-19: Secondary | ICD-10-CM | POA: Diagnosis not present

## 2021-05-16 ENCOUNTER — Encounter (HOSPITAL_COMMUNITY)
Admission: RE | Admit: 2021-05-16 | Discharge: 2021-05-16 | Disposition: A | Discharge status: Home / Self Care | Payer: 59 | Source: Ambulatory Visit | Attending: General Surgery | Admitting: General Surgery

## 2021-05-16 ENCOUNTER — Other Ambulatory Visit: Payer: Self-pay

## 2021-05-16 ENCOUNTER — Other Ambulatory Visit (HOSPITAL_COMMUNITY): Payer: 59

## 2021-05-16 LAB — SARS CORONAVIRUS 2 (TAT 6-24 HRS): SARS Coronavirus 2: NEGATIVE

## 2021-05-17 NOTE — Anesthesia Preprocedure Evaluation (Addendum)
Anesthesia Evaluation  Patient identified by MRN, date of birth, ID band Patient awake    Reviewed: Allergy & Precautions, NPO status , Patient's Chart, lab work & pertinent test results  History of Anesthesia Complications Negative for: history of anesthetic complications  Airway Mallampati: II  TM Distance: >3 FB Neck ROM: Full    Dental  (+) Teeth Intact, Dental Advisory Given   Pulmonary neg pulmonary ROS,  05/15/2021 SARS coronavirus NEG   breath sounds clear to auscultation       Cardiovascular negative cardio ROS   Rhythm:Regular Rate:Normal     Neuro/Psych PSYCHIATRIC DISORDERS (ADHD) Anxiety negative neurological ROS     GI/Hepatic Neg liver ROS, GERD  Controlled,  Endo/Other  negative endocrine ROS  Renal/GU negative Renal ROS     Musculoskeletal   Abdominal   Peds  Hematology negative hematology ROS (+)   Anesthesia Other Findings   Reproductive/Obstetrics                            Anesthesia Physical Anesthesia Plan  ASA: 1  Anesthesia Plan: General   Post-op Pain Management: Tylenol PO (pre-op)* and Toradol IV (intra-op)*   Induction: Intravenous  PONV Risk Score and Plan: Ondansetron, Dexamethasone and Scopolamine patch - Pre-op  Airway Management Planned: Oral ETT  Additional Equipment: None  Intra-op Plan:   Post-operative Plan: Extubation in OR  Informed Consent: I have reviewed the patients History and Physical, chart, labs and discussed the procedure including the risks, benefits and alternatives for the proposed anesthesia with the patient or authorized representative who has indicated his/her understanding and acceptance.     Dental advisory given  Plan Discussed with: CRNA and Surgeon  Anesthesia Plan Comments:        Anesthesia Quick Evaluation

## 2021-05-18 ENCOUNTER — Encounter (HOSPITAL_COMMUNITY)
Admission: RE | Disposition: A | Discharge status: Home / Self Care | Payer: Self-pay | Source: Home / Self Care | Attending: General Surgery

## 2021-05-18 ENCOUNTER — Ambulatory Visit (HOSPITAL_BASED_OUTPATIENT_CLINIC_OR_DEPARTMENT_OTHER): Payer: 59 | Admitting: Certified Registered Nurse Anesthetist

## 2021-05-18 ENCOUNTER — Ambulatory Visit (HOSPITAL_COMMUNITY): Payer: 59 | Admitting: Certified Registered Nurse Anesthetist

## 2021-05-18 ENCOUNTER — Observation Stay (HOSPITAL_COMMUNITY)
Admission: RE | Admit: 2021-05-18 | Discharge: 2021-05-19 | Disposition: A | Discharge status: Home / Self Care | Payer: 59 | Attending: General Surgery | Admitting: General Surgery

## 2021-05-18 ENCOUNTER — Encounter (HOSPITAL_COMMUNITY): Payer: Self-pay | Admitting: General Surgery

## 2021-05-18 ENCOUNTER — Other Ambulatory Visit: Payer: Self-pay

## 2021-05-18 DIAGNOSIS — Z20822 Contact with and (suspected) exposure to covid-19: Secondary | ICD-10-CM | POA: Diagnosis not present

## 2021-05-18 DIAGNOSIS — D734 Cyst of spleen: Principal | ICD-10-CM | POA: Insufficient documentation

## 2021-05-18 DIAGNOSIS — D7389 Other diseases of spleen: Secondary | ICD-10-CM | POA: Insufficient documentation

## 2021-05-18 DIAGNOSIS — Z01818 Encounter for other preprocedural examination: Secondary | ICD-10-CM

## 2021-05-18 DIAGNOSIS — Z01812 Encounter for preprocedural laboratory examination: Secondary | ICD-10-CM

## 2021-05-18 HISTORY — PX: LAPAROSCOPIC SPLENECTOMY: SHX409

## 2021-05-18 LAB — CREATININE, SERUM
Creatinine, Ser: 0.69 mg/dL (ref 0.44–1.00)
GFR, Estimated: >60 mL/min (ref >60)

## 2021-05-18 LAB — CBC
HCT: 35.2 % — ABNORMAL LOW (ref 36.0–46.0)
Hemoglobin: 12.4 g/dL (ref 12.0–15.0)
MCH: 31.6 pg (ref 26.0–34.0)
MCHC: 35.2 g/dL (ref 30.0–36.0)
MCV: 89.6 fL (ref 80.0–100.0)
Platelets: 232 10*3/uL (ref 150–400)
RBC: 3.93 MIL/uL (ref 3.87–5.11)
RDW: 12.3 % (ref 11.5–15.5)
WBC: 15.3 10*3/uL — ABNORMAL HIGH (ref 4.0–10.5)
nRBC: 0 % (ref 0.0–0.2)

## 2021-05-18 LAB — PREGNANCY, URINE: Preg Test, Ur: NEGATIVE

## 2021-05-18 SURGERY — SPLENECTOMY, LAPAROSCOPIC
Anesthesia: General | Site: Abdomen

## 2021-05-18 MED ORDER — DIPHENHYDRAMINE HCL 25 MG PO CAPS
25.0000 mg | ORAL_CAPSULE | Freq: Four times a day (QID) | ORAL | Status: DC | PRN
Start: 2021-05-18 — End: 2021-05-19

## 2021-05-18 MED ORDER — DEXAMETHASONE SODIUM PHOSPHATE 10 MG/ML IJ SOLN
INTRAMUSCULAR | Status: AC
  Filled 2021-05-18: qty 1

## 2021-05-18 MED ORDER — HYDROMORPHONE HCL 1 MG/ML IJ SOLN
0.5000 mg | INTRAMUSCULAR | Status: DC | PRN
  Administered 2021-05-18 – 2021-05-19 (×4): 0.5 mg via INTRAVENOUS
  Filled 2021-05-18 (×4): qty 0.5

## 2021-05-18 MED ORDER — FENTANYL CITRATE (PF) 250 MCG/5ML IJ SOLN
INTRAMUSCULAR | Status: AC
  Filled 2021-05-18: qty 5

## 2021-05-18 MED ORDER — PRENATAL PLUS 27-1 MG PO TABS
1.0000 | ORAL_TABLET | Freq: Every day | ORAL | Status: DC
  Administered 2021-05-18 – 2021-05-19 (×2): 1 via ORAL
  Filled 2021-05-18 (×2): qty 1

## 2021-05-18 MED ORDER — FENTANYL CITRATE (PF) 250 MCG/5ML IJ SOLN
INTRAMUSCULAR | Status: DC | PRN
  Administered 2021-05-18: 50 ug via INTRAVENOUS
  Administered 2021-05-18: 150 ug via INTRAVENOUS
  Administered 2021-05-18: 50 ug via INTRAVENOUS

## 2021-05-18 MED ORDER — OXYCODONE HCL 5 MG PO TABS: 5.0000 mg | ORAL_TABLET | Freq: Once | ORAL | Status: DC | PRN

## 2021-05-18 MED ORDER — NAPROXEN 250 MG PO TABS
500.0000 mg | ORAL_TABLET | Freq: Every day | ORAL | Status: DC
  Administered 2021-05-18: 500 mg via ORAL
  Filled 2021-05-18: qty 2

## 2021-05-18 MED ORDER — SCOPOLAMINE 1 MG/3DAYS TD PT72
1.0000 | MEDICATED_PATCH | TRANSDERMAL | Status: DC
  Administered 2021-05-18: 1.5 mg via TRANSDERMAL
  Filled 2021-05-18: qty 1

## 2021-05-18 MED ORDER — ROCURONIUM BROMIDE 10 MG/ML (PF) SYRINGE
PREFILLED_SYRINGE | INTRAVENOUS | Status: AC
  Filled 2021-05-18: qty 10

## 2021-05-18 MED ORDER — CHLORHEXIDINE GLUCONATE CLOTH 2 % EX PADS: 6.0000 | MEDICATED_PAD | Freq: Once | CUTANEOUS | Status: DC

## 2021-05-18 MED ORDER — LIDOCAINE HCL 2 % IJ SOLN
INTRAMUSCULAR | Status: AC
  Filled 2021-05-18: qty 20

## 2021-05-18 MED ORDER — METOPROLOL TARTRATE 5 MG/5ML IV SOLN: 5.0000 mg | Freq: Four times a day (QID) | INTRAVENOUS | Status: DC | PRN

## 2021-05-18 MED ORDER — BUPIVACAINE LIPOSOME 1.3 % IJ SUSP
INTRAMUSCULAR | Status: DC | PRN
  Administered 2021-05-18: 20 mL

## 2021-05-18 MED ORDER — BUPIVACAINE-EPINEPHRINE 0.25% -1:200000 IJ SOLN
INTRAMUSCULAR | Status: DC | PRN
  Administered 2021-05-18: 30 mL

## 2021-05-18 MED ORDER — ONDANSETRON HCL 4 MG/2ML IJ SOLN
INTRAMUSCULAR | Status: AC
  Filled 2021-05-18: qty 2

## 2021-05-18 MED ORDER — KETAMINE HCL 10 MG/ML IJ SOLN
INTRAMUSCULAR | Status: DC | PRN
  Administered 2021-05-18: 30 mg via INTRAVENOUS
  Administered 2021-05-18: 20 mg via INTRAVENOUS

## 2021-05-18 MED ORDER — OXYCODONE HCL 5 MG/5ML PO SOLN: 5.0000 mg | Freq: Once | ORAL | Status: DC | PRN

## 2021-05-18 MED ORDER — SIMETHICONE 80 MG PO CHEW: 40.0000 mg | CHEWABLE_TABLET | Freq: Four times a day (QID) | ORAL | Status: DC | PRN

## 2021-05-18 MED ORDER — PROPOFOL 10 MG/ML IV BOLUS
INTRAVENOUS | Status: DC | PRN
Start: 2021-05-18 — End: 2021-05-18
  Administered 2021-05-18: 150 mg via INTRAVENOUS

## 2021-05-18 MED ORDER — DEXMEDETOMIDINE (PRECEDEX) IN NS 20 MCG/5ML (4 MCG/ML) IV SYRINGE
PREFILLED_SYRINGE | INTRAVENOUS | Status: DC | PRN
  Administered 2021-05-18: 4 ug via INTRAVENOUS
  Administered 2021-05-18: 8 ug via INTRAVENOUS

## 2021-05-18 MED ORDER — OXYCODONE HCL 5 MG PO TABS
5.0000 mg | ORAL_TABLET | ORAL | Status: DC | PRN
  Administered 2021-05-18 – 2021-05-19 (×3): 10 mg via ORAL
  Filled 2021-05-18 (×2): qty 1
  Filled 2021-05-18 (×2): qty 2

## 2021-05-18 MED ORDER — LACTATED RINGERS IR SOLN
Status: DC | PRN
  Administered 2021-05-18: 1000 mL

## 2021-05-18 MED ORDER — HYDROMORPHONE HCL 1 MG/ML IJ SOLN
0.2500 mg | INTRAMUSCULAR | Status: DC | PRN
  Administered 2021-05-18: 0.5 mg via INTRAVENOUS

## 2021-05-18 MED ORDER — KETAMINE HCL 50 MG/5ML IJ SOSY
PREFILLED_SYRINGE | INTRAMUSCULAR | Status: AC
Start: 2021-05-18 — End: ?
  Filled 2021-05-18: qty 5

## 2021-05-18 MED ORDER — MIDAZOLAM HCL 2 MG/2ML IJ SOLN: 0.5000 mg | Freq: Once | INTRAMUSCULAR | Status: DC | PRN

## 2021-05-18 MED ORDER — MELATONIN 5 MG PO TABS
10.0000 mg | ORAL_TABLET | Freq: Every day | ORAL | Status: DC
Start: 2021-05-18 — End: 2021-05-19
  Administered 2021-05-18: 10 mg via ORAL
  Filled 2021-05-18: qty 2

## 2021-05-18 MED ORDER — "VISTASEAL 4 ML SINGLE DOSE KIT "
4.0000 mL | PACK | Freq: Once | CUTANEOUS | Status: DC
  Filled 2021-05-18: qty 4

## 2021-05-18 MED ORDER — BUPIVACAINE-EPINEPHRINE (PF) 0.25% -1:200000 IJ SOLN
INTRAMUSCULAR | Status: AC
  Filled 2021-05-18: qty 30

## 2021-05-18 MED ORDER — LACTATED RINGERS IV SOLN: INTRAVENOUS | Status: DC

## 2021-05-18 MED ORDER — DEXMEDETOMIDINE (PRECEDEX) IN NS 20 MCG/5ML (4 MCG/ML) IV SYRINGE
PREFILLED_SYRINGE | INTRAVENOUS | Status: AC
Start: 2021-05-18 — End: ?
  Filled 2021-05-18: qty 5

## 2021-05-18 MED ORDER — ONDANSETRON 4 MG PO TBDP: 4.0000 mg | ORAL_TABLET | Freq: Four times a day (QID) | ORAL | Status: DC | PRN

## 2021-05-18 MED ORDER — MIDAZOLAM HCL 5 MG/5ML IJ SOLN
INTRAMUSCULAR | Status: DC | PRN
  Administered 2021-05-18: 2 mg via INTRAVENOUS

## 2021-05-18 MED ORDER — ONDANSETRON HCL 4 MG/2ML IJ SOLN
4.0000 mg | Freq: Four times a day (QID) | INTRAMUSCULAR | Status: DC | PRN
Start: 2021-05-18 — End: 2021-05-19

## 2021-05-18 MED ORDER — LIDOCAINE 2% (20 MG/ML) 5 ML SYRINGE
INTRAMUSCULAR | Status: DC | PRN
  Administered 2021-05-18: 1.5 mg/kg/h via INTRAVENOUS

## 2021-05-18 MED ORDER — BUPIVACAINE LIPOSOME 1.3 % IJ SUSP
INTRAMUSCULAR | Status: AC
  Filled 2021-05-18: qty 20

## 2021-05-18 MED ORDER — ACETAMINOPHEN 500 MG PO TABS
1000.0000 mg | ORAL_TABLET | ORAL | Status: AC
  Administered 2021-05-18: 1000 mg via ORAL
  Filled 2021-05-18: qty 2

## 2021-05-18 MED ORDER — LIDOCAINE 2% (20 MG/ML) 5 ML SYRINGE
INTRAMUSCULAR | Status: DC | PRN
  Administered 2021-05-18: 40 mg via INTRAVENOUS

## 2021-05-18 MED ORDER — BUPIVACAINE LIPOSOME 1.3 % IJ SUSP
20.0000 mL | Freq: Once | INTRAMUSCULAR | Status: DC
Start: 2021-05-18 — End: 2021-05-18

## 2021-05-18 MED ORDER — ENSURE PRE-SURGERY PO LIQD
296.0000 mL | Freq: Once | ORAL | Status: DC
  Filled 2021-05-18: qty 296

## 2021-05-18 MED ORDER — ORAL CARE MOUTH RINSE: 15.0000 mL | Freq: Once | OROMUCOSAL | Status: AC

## 2021-05-18 MED ORDER — HYDROMORPHONE HCL 1 MG/ML IJ SOLN
INTRAMUSCULAR | Status: AC
  Administered 2021-05-18: 0.5 mg via INTRAVENOUS
  Filled 2021-05-18: qty 2

## 2021-05-18 MED ORDER — ARTIFICIAL TEARS OPHTHALMIC OINT
TOPICAL_OINTMENT | OPHTHALMIC | Status: AC
  Filled 2021-05-18: qty 3.5

## 2021-05-18 MED ORDER — CEFAZOLIN SODIUM-DEXTROSE 2-4 GM/100ML-% IV SOLN
2.0000 g | INTRAVENOUS | Status: AC
  Administered 2021-05-18: 2 g via INTRAVENOUS
  Filled 2021-05-18: qty 100

## 2021-05-18 MED ORDER — CHLORHEXIDINE GLUCONATE 0.12 % MT SOLN
15.0000 mL | Freq: Once | OROMUCOSAL | Status: AC
  Administered 2021-05-18: 15 mL via OROMUCOSAL

## 2021-05-18 MED ORDER — ENOXAPARIN SODIUM 40 MG/0.4ML IJ SOSY
40.0000 mg | PREFILLED_SYRINGE | INTRAMUSCULAR | Status: DC
  Administered 2021-05-19: 40 mg via SUBCUTANEOUS
  Filled 2021-05-18: qty 0.4

## 2021-05-18 MED ORDER — MESALAMINE 1000 MG RE SUPP: 1000.0000 mg | RECTAL | Status: DC

## 2021-05-18 MED ORDER — SUGAMMADEX SODIUM 200 MG/2ML IV SOLN
INTRAVENOUS | Status: DC | PRN
  Administered 2021-05-18: 200 mg via INTRAVENOUS

## 2021-05-18 MED ORDER — MIDAZOLAM HCL 2 MG/2ML IJ SOLN
INTRAMUSCULAR | Status: AC
  Filled 2021-05-18: qty 2

## 2021-05-18 MED ORDER — PROPOFOL 10 MG/ML IV BOLUS
INTRAVENOUS | Status: AC
  Filled 2021-05-18: qty 20

## 2021-05-18 MED ORDER — ACETAMINOPHEN 500 MG PO TABS
1000.0000 mg | ORAL_TABLET | Freq: Once | ORAL | Status: DC
  Filled 2021-05-18: qty 2

## 2021-05-18 MED ORDER — 0.9 % SODIUM CHLORIDE (POUR BTL) OPTIME
TOPICAL | Status: DC | PRN
  Administered 2021-05-18: 1000 mL

## 2021-05-18 MED ORDER — ENSURE SURGERY PO LIQD
237.0000 mL | Freq: Two times a day (BID) | ORAL | Status: DC
  Administered 2021-05-18 – 2021-05-19 (×2): 237 mL via ORAL

## 2021-05-18 MED ORDER — AMPHETAMINE-DEXTROAMPHETAMINE 20 MG PO TABS
20.0000 mg | ORAL_TABLET | Freq: Every day | ORAL | Status: DC | PRN
Start: 2021-05-18 — End: 2021-05-19

## 2021-05-18 MED ORDER — MEPERIDINE HCL 50 MG/ML IJ SOLN: 6.2500 mg | INTRAMUSCULAR | Status: DC | PRN

## 2021-05-18 MED ORDER — ONDANSETRON HCL 4 MG/2ML IJ SOLN
INTRAMUSCULAR | Status: DC | PRN
Start: 2021-05-18 — End: 2021-05-18
  Administered 2021-05-18: 4 mg via INTRAVENOUS

## 2021-05-18 MED ORDER — PROMETHAZINE HCL 25 MG/ML IJ SOLN: 6.2500 mg | INTRAMUSCULAR | Status: DC | PRN

## 2021-05-18 MED ORDER — DIPHENHYDRAMINE HCL 50 MG/ML IJ SOLN
25.0000 mg | Freq: Four times a day (QID) | INTRAMUSCULAR | Status: DC | PRN
Start: 2021-05-18 — End: 2021-05-19

## 2021-05-18 MED ORDER — ACETAMINOPHEN 500 MG PO TABS
1000.0000 mg | ORAL_TABLET | Freq: Four times a day (QID) | ORAL | Status: DC
  Administered 2021-05-18 – 2021-05-19 (×4): 1000 mg via ORAL
  Filled 2021-05-18 (×5): qty 2

## 2021-05-18 MED ORDER — DEXAMETHASONE SODIUM PHOSPHATE 10 MG/ML IJ SOLN
INTRAMUSCULAR | Status: DC | PRN
  Administered 2021-05-18: 10 mg via INTRAVENOUS

## 2021-05-18 MED ORDER — CELECOXIB 200 MG PO CAPS
400.0000 mg | ORAL_CAPSULE | ORAL | Status: AC
  Administered 2021-05-18: 400 mg via ORAL
  Filled 2021-05-18: qty 2

## 2021-05-18 MED ORDER — KETOROLAC TROMETHAMINE 30 MG/ML IJ SOLN
INTRAMUSCULAR | Status: AC
  Filled 2021-05-18: qty 1

## 2021-05-18 MED ORDER — POLYETHYLENE GLYCOL 3350 17 G PO PACK: 17.0000 g | PACK | Freq: Every day | ORAL | Status: DC | PRN

## 2021-05-18 MED ORDER — KETOROLAC TROMETHAMINE 30 MG/ML IJ SOLN
INTRAMUSCULAR | Status: DC | PRN
  Administered 2021-05-18: 30 mg via INTRAVENOUS

## 2021-05-18 MED ORDER — ROCURONIUM BROMIDE 10 MG/ML (PF) SYRINGE
PREFILLED_SYRINGE | INTRAVENOUS | Status: DC | PRN
Start: 2021-05-18 — End: 2021-05-18
  Administered 2021-05-18: 30 mg via INTRAVENOUS
  Administered 2021-05-18: 60 mg via INTRAVENOUS

## 2021-05-18 MED ORDER — LIDOCAINE HCL (PF) 2 % IJ SOLN
INTRAMUSCULAR | Status: AC
  Filled 2021-05-18: qty 5

## 2021-05-18 SURGICAL SUPPLY — 40 items
APPLICATOR VISTASEAL 35 (MISCELLANEOUS) ×2 IMPLANT
BAG COUNTER SPONGE SURGICOUNT (BAG) ×1 IMPLANT
BLADE CLIPPER SURG (BLADE) IMPLANT
CHLORAPREP W/TINT 26 (MISCELLANEOUS) ×2 IMPLANT
COVER SURGICAL LIGHT HANDLE (MISCELLANEOUS) ×2 IMPLANT
DERMABOND ADVANCED (GAUZE/BANDAGES/DRESSINGS) ×1
DERMABOND ADVANCED .7 DNX12 (GAUZE/BANDAGES/DRESSINGS) IMPLANT
DRAPE LAPAROSCOPIC ABDOMINAL (DRAPES) ×2 IMPLANT
DRAPE WARM FLUID 44X44 (DRAPES) ×1 IMPLANT
DRSG PAD ABDOMINAL 8X10 ST (GAUZE/BANDAGES/DRESSINGS) IMPLANT
ELECT PENCIL ROCKER SW 15FT (MISCELLANEOUS) ×1 IMPLANT
ELECT REM PT RETURN 15FT ADLT (MISCELLANEOUS) ×1 IMPLANT
GAUZE SPONGE 4X4 12PLY STRL (GAUZE/BANDAGES/DRESSINGS) IMPLANT
GLOVE SURG ENC MOIS LTX SZ6 (GLOVE) ×1 IMPLANT
GLOVE SURG UNDER LTX SZ6.5 (GLOVE) ×1 IMPLANT
GOWN STRL REUS W/ TWL LRG LVL3 (GOWN DISPOSABLE) ×3 IMPLANT
GOWN STRL REUS W/TWL 2XL LVL3 (GOWN DISPOSABLE) ×1 IMPLANT
GOWN STRL REUS W/TWL LRG LVL3 (GOWN DISPOSABLE) ×4
KIT BASIN OR (CUSTOM PROCEDURE TRAY) ×2 IMPLANT
KIT TURNOVER KIT B (KITS) ×2 IMPLANT
L-HOOK LAP DISP 36CM (ELECTROSURGICAL)
LHOOK LAP DISP 36CM (ELECTROSURGICAL) ×1 IMPLANT
NS IRRIG 1000ML POUR BTL (IV SOLUTION) ×2 IMPLANT
PAD ARMBOARD 7.5X6 YLW CONV (MISCELLANEOUS) ×4 IMPLANT
RELOAD STAPLE 60 2.6 WHT THN (STAPLE) ×4 IMPLANT
RELOAD STAPLER WHITE 60MM (STAPLE) ×1 IMPLANT
SET IRRIG TUBING LAPAROSCOPIC (IRRIGATION / IRRIGATOR) ×2 IMPLANT
SHEARS HARMONIC ACE PLUS 36CM (ENDOMECHANICALS) ×1 IMPLANT
SLEEVE ENDOPATH XCEL 5M (ENDOMECHANICALS) ×5 IMPLANT
SLEEVE SURGEON STRL (DRAPES) ×1 IMPLANT
STAPLE ECHEON FLEX 60 POW ENDO (STAPLE) ×2 IMPLANT
STAPLER RELOAD WHITE 60MM (STAPLE) ×2
SUT MNCRL AB 4-0 PS2 18 (SUTURE) ×2 IMPLANT
SYS LAPSCP GELPORT 120MM (MISCELLANEOUS)
SYSTEM LAPSCP GELPORT 120MM (MISCELLANEOUS) IMPLANT
TRAY FOLEY MTR SLVR 14FR STAT (SET/KITS/TRAYS/PACK) ×2 IMPLANT
TROCAR BLADELESS 15MM (ENDOMECHANICALS) ×2 IMPLANT
TROCAR XCEL 12X100 BLDLESS (ENDOMECHANICALS) ×1 IMPLANT
TROCAR XCEL NON-BLD 11X100MML (ENDOMECHANICALS) IMPLANT
TROCAR XCEL NON-BLD 5MMX100MML (ENDOMECHANICALS) ×2 IMPLANT

## 2021-05-18 NOTE — Anesthesia Postprocedure Evaluation (Signed)
Anesthesia Post Note  Patient: Cynthia Campbell  Procedure(s) Performed: LAPAROSCOPIC SPLENECTOMY (Abdomen)     Patient location during evaluation: Nursing Unit Anesthesia Type: General Level of consciousness: awake and alert, patient cooperative and oriented Pain management: pain level controlled Vital Signs Assessment: post-procedure vital signs reviewed and stable Respiratory status: spontaneous breathing, nonlabored ventilation and respiratory function stable Cardiovascular status: blood pressure returned to baseline and stable Postop Assessment: no apparent nausea or vomiting, able to ambulate and adequate PO intake Anesthetic complications: no   No notable events documented.  Last Vitals:  Vitals:   05/18/21 1237 05/18/21 1343  BP: 112/69 114/69  Pulse: (!) 58 70  Resp: 16 16  Temp: 36.7 C 36.7 C  SpO2: 100% 100%    Last Pain:  Vitals:   05/18/21 1426  PainSc: 6                  Torrie Namba,E. Niala Stcharles

## 2021-05-18 NOTE — Op Note (Signed)
Preoperative diagnosis: splenic cyst  Postoperative diagnosis: same   Procedure: laparoscopic splenectomy  Surgeon: Gurney Maxin, M.D.  Asst: Michaelle Birks, M.D.  Anesthesia: general  Indications for procedure: Cynthia Campbell is a 36 y.o. year old female with working up showing large splenic cyst. She presents for splenectomy.  Description of procedure: The patient was brought into the operative suite. Anesthesia was administered with General endotracheal anesthesia. WHO checklist was applied. The patient was then placed in left lateral position. The area was prepped and draped in the usual sterile fashion.  Next, a small incision was placed in the epigastric area. A 47mm trocar was used to gain access to the peritoneal cavity by optical entry technique. Pneumoperitoneum was applied with a high flow and low pressure. The laparoscope was reinserted to confirm position. 20 ml of Exparel:Marcaine was used for a L TAP block  3 additional trocars were placed. 1 12 mm in the supraumbilical area, 1 5 mm trocar in the left lateral space and 1 5 mm trocar in the left mid abdomen.  The spleen was visualized and appeared enlarged with some calcifications seen on the lateral body. The splenocolic ligament was divided with harmonic scalpel. Attachments to the lateral spleen were divided with harmonic scapel. Short gastric vessels were taken with harmonic scalpel. Continued dissection continued to get to the splenic vessels. The vessels were taken with a 60 mm echelon white load stapler. The spleen was placed into a specimen bag. The epigastric 5 mm and supraumbilical 12 mm incisions were combined for an extraction site. Cautery was used to divide the subcutaneous tissue and fascia was entered in the midline. The spleen was removed. The fascia was closed with 0 PDS in running fashion. The remainder of Exparel:Marcaine mix was injected into the muscle.  Insufflation was restarted. The dissection area was  inspected and irrigated and appeared hemostatic. Pneumoperitoneum was removed. All skin incisions were closed with 4-0 monocryl. Dermabond was put in place for dressing. All counts were correct.  Findings: large spleen  Specimen: spleen  Implant: none   Blood loss: 50 ml  Local anesthesia:  50 ml Exparel:Marcaine Mix  Complications: none  Gurney Maxin, M.D. General, Bariatric, & Minimally Invasive Surgery [facility]

## 2021-05-18 NOTE — H&P (Signed)
History of Present Illness: Cynthia Campbell is a 36 y.o. female who is seen today as an office consultation at the request of Dr. 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   She has had worsening pain over the last 6 months. Pain is on the left side of her ribs. It radiates to her left shoulder and arm. It sometimes radiates to her right shoulder. She denies trauma. She has tried NSAIDs and muscle relaxants with minimal improvement.  She last traveled outside the country ([place] 5 years ago.  Review of Systems: A complete review of systems was obtained from the patient. I have reviewed this information and discussed as appropriate with the patient. See HPI as well for other ROS.  Review of Systems  Constitutional: Negative.  HENT: Negative.  Eyes: Negative.  Respiratory: Negative.  Cardiovascular: Negative.  Gastrointestinal: Negative.  Genitourinary: Negative.  Musculoskeletal: Negative.  Skin: Negative.  Neurological: Negative.  Endo/Heme/Allergies: Negative.  Psychiatric/Behavioral: Negative.    Medical History: History reviewed. No pertinent past medical history.  There is no problem list on file for this patient.  History reviewed. No pertinent surgical history.   No Known Allergies  Current Outpatient Medications on File Prior to Visit  Medication Sig Dispense Refill   dextroamphetamine-amphetamine (ADDERALL) 10 mg tablet Take 1 tablet (10 mg total) by mouth   mesalamine (CANASA) 1000 MG suppository mesalamine 1,000 mg rectal suppository   prenatal vit-iron fum-folic ac (PRENAVITE) tablet Take 1 tablet by mouth once daily   No current facility-administered medications on file prior to visit.   Family History  Problem Relation Age of Onset   Hyperlipidemia (Elevated cholesterol) Father    Social History   Tobacco Use  Smoking Status Never  Smokeless Tobacco Never    Social History   Socioeconomic History   Marital status: Married  Tobacco Use   Smoking  status: Never   Smokeless tobacco: Never  Substance and Sexual Activity   Alcohol use: Yes  Alcohol/week: 3.0 standard drinks  Types: 3 Glasses of wine per week   Drug use: Never   Objective:   Vitals:  03/07/21 1018  BP: 112/78  Pulse: 97  Temp: 36.8 C (98.2 F)  SpO2: 99%  Weight: 70.8 kg (156 lb)  Height: 177.8 cm (5\' 10" )   Body mass index is 22.38 kg/m.  Physical Exam Constitutional:  Appearance: Normal appearance.  HENT:  Head: Normocephalic and atraumatic.  Pulmonary:  Effort: Pulmonary effort is normal.  Musculoskeletal:  General: Normal range of motion.  Cervical back: Normal range of motion.  Neurological:  General: No focal deficit present.  Mental Status: She is alert and oriented to person, place, and time. Mental status is at baseline.  Psychiatric:  Mood and Affect: Mood normal.  Behavior: Behavior normal.  Thought Content: Thought content normal.     Labs, Imaging and Diagnostic Testing: I reviewed CT scan images showing a spleen with 5.5 cm cyst with septations and calcifications in the mid body. I reviewed notes by   Assessment and Plan:  Diagnoses and all orders for this visit:  Splenic cyst  The anatomy and physiology of the spleen was discussed. Pathophysiology of the disease was discussed. Options were discussed, and I made a recommendation to remove the spleen to help treat the pathology. Minimally invasive & open techniques discussed.  Risks of bleeding, infection, injury to other organs, reoperation, death, and other risks were discussed. I noted a good likelihood this will help address the problem. While there are risks,  I feel the risks of nonoperative management are greater; therefore, I feel surgery offers the best option. Educational material was available. We will work to minimize complications.   H flu, pneumococcal, meningicoccal vaccines now

## 2021-05-18 NOTE — Anesthesia Procedure Notes (Signed)
Procedure Name: Intubation Date/Time: 05/18/2021 7:35 AM Performed by: Maxwell Caul, CRNA Pre-anesthesia Checklist: Patient identified, Emergency Drugs available, Suction available and Patient being monitored Patient Re-evaluated:Patient Re-evaluated prior to induction Oxygen Delivery Method: Circle system utilized Preoxygenation: Pre-oxygenation with 100% oxygen Induction Type: IV induction Ventilation: Mask ventilation without difficulty Laryngoscope Size: Mac and 4 Grade View: Grade I Tube type: Oral Tube size: 7.0 mm Number of attempts: 1 Airway Equipment and Method: Stylet Placement Confirmation: ETT inserted through vocal cords under direct vision, positive ETCO2 and breath sounds checked- equal and bilateral Secured at: 21 cm Tube secured with: Tape Dental Injury: Teeth and Oropharynx as per pre-operative assessment

## 2021-05-18 NOTE — Transfer of Care (Signed)
Immediate Anesthesia Transfer of Care Note  Patient: Cynthia Campbell  Procedure(s) Performed: LAPAROSCOPIC SPLENECTOMY (Abdomen)  Patient Location: PACU  Anesthesia Type:General  Level of Consciousness: awake, alert  and oriented  Airway & Oxygen Therapy: Patient Spontanous Breathing and Patient connected to face mask oxygen  Post-op Assessment: Report given to RN and Post -op Vital signs reviewed and stable  Post vital signs: Reviewed and stable  Last Vitals:  Vitals Value Taken Time  BP 147/86 05/18/21 0930  Temp    Pulse 80 05/18/21 0933  Resp 24 05/18/21 0933  SpO2 100 % 05/18/21 0933  Vitals shown include unvalidated device data.  Last Pain:  Vitals:   05/18/21 0606  PainSc: 2       Patients Stated Pain Goal: 1 (00/16/42 9037)  Complications: No notable events documented.

## 2021-05-19 ENCOUNTER — Encounter (HOSPITAL_COMMUNITY): Payer: Self-pay | Admitting: General Surgery

## 2021-05-19 DIAGNOSIS — D734 Cyst of spleen: Secondary | ICD-10-CM | POA: Diagnosis not present

## 2021-05-19 DIAGNOSIS — D7389 Other diseases of spleen: Secondary | ICD-10-CM | POA: Diagnosis not present

## 2021-05-19 DIAGNOSIS — Z20822 Contact with and (suspected) exposure to covid-19: Secondary | ICD-10-CM | POA: Diagnosis not present

## 2021-05-19 LAB — CBC
HCT: 36.3 % (ref 36.0–46.0)
Hemoglobin: 12.4 g/dL (ref 12.0–15.0)
MCH: 31.2 pg (ref 26.0–34.0)
MCHC: 34.2 g/dL (ref 30.0–36.0)
MCV: 91.4 fL (ref 80.0–100.0)
Platelets: 245 10*3/uL (ref 150–400)
RBC: 3.97 MIL/uL (ref 3.87–5.11)
RDW: 12.5 % (ref 11.5–15.5)
WBC: 13.1 10*3/uL — ABNORMAL HIGH (ref 4.0–10.5)
nRBC: 0 % (ref 0.0–0.2)

## 2021-05-19 MED ORDER — OXYCODONE HCL 5 MG PO TABS: 5.0000 mg | ORAL_TABLET | Freq: Four times a day (QID) | ORAL | 0 refills | Status: DC | PRN

## 2021-05-19 NOTE — Progress Notes (Signed)
1 Day Post-Op   Subjective/Chief Complaint: Complains of soreness. Otherwise ok   Objective: Vital signs in last 24 hours: Temp:  [97.5 F (36.4 C)-98.6 F (37 C)] 98.3 F (36.8 C) (02/25 0900) Pulse Rate:  [55-78] 55 (02/25 0900) Resp:  [16-18] 16 (02/25 0900) BP: (107-128)/(55-74) 122/55 (02/25 0900) SpO2:  [100 %] 100 % (02/25 0900)    Intake/Output from previous day: 02/24 0701 - 02/25 0700 In: 2180 [P.O.:480; I.V.:1600; IV Piggyback:100] Out: 2850 [Urine:2800; Blood:50] Intake/Output this shift: No intake/output data recorded.  General appearance: alert and cooperative Resp: clear to auscultation bilaterally Cardio: regular rate and rhythm GI: soft, mild tenderness. Incisions look good. Tolerating diet  Lab Results:  Recent Labs    05/18/21 1100 05/19/21 0417  WBC 15.3* 13.1*  HGB 12.4 12.4  HCT 35.2* 36.3  PLT 232 245   BMET Recent Labs    05/18/21 1100  CREATININE 0.69   PT/INR No results for input(s): LABPROT, INR in the last 72 hours. ABG No results for input(s): PHART, HCO3 in the last 72 hours.  Invalid input(s): PCO2, PO2  Studies/Results: No results found.  Anti-infectives: Anti-infectives (From admission, onward)    Start     Dose/Rate Route Frequency Ordered Stop   05/18/21 0600  ceFAZolin (ANCEF) IVPB 2g/100 mL premix        2 g 200 mL/hr over 30 Minutes Intravenous On call to O.R. 05/18/21 0552 05/18/21 0736       Assessment/Plan: s/p Procedure(s): LAPAROSCOPIC SPLENECTOMY (N/A) Advance diet Discharge  LOS: 0 days    Autumn Messing III 05/19/2021

## 2021-05-19 NOTE — Plan of Care (Signed)

## 2021-05-19 NOTE — Progress Notes (Signed)
Discharged via wc to front entrance accompanied by NT and husband. Assessment unchanged. Pt and husband verbalized understanding of dc instructions through teach back.

## 2021-05-22 LAB — SURGICAL PATHOLOGY

## 2021-05-22 NOTE — Discharge Summary (Signed)
[facility] Discharge Summary   Patient ID: Cynthia Campbell MRN: [id number] DOB/AGE: June 05, 1985 36 y.o.  Admit date: 05/18/2021 Discharge date: 05/22/2021  Admitting Diagnosis: Splenic cyst  Discharge Diagnosis Patient Active Problem List   Diagnosis Date Noted   Splenic cyst 05/18/2021   Encounter for elective induction of labor 11/15/2019   Normal labor and delivery 11/23/2017    Consultants None  Imaging: No results found.  Procedures Dr. Kieth Brightly (05/18/2021) - laparoscopic splenectomy  Hospital Course:  Cynthia Campbell is a 36yo female who was admitted to [facility] for splenectomy for a splenic cyst.  She underwent the procedure on 05/18/21.  Tolerated procedure well and was transferred to the floor.  Diet was advanced as tolerated.  On POD1 the patient was felt stable for discharge home.  Patient will follow up as below and knows to call with questions or concerns.    I was not directly involved in this patient's care therefore the information in this discharge summary was taken from the chart.    Allergies as of 05/19/2021   No Known Allergies      Medication List     TAKE these medications    amphetamine-dextroamphetamine 20 MG tablet Commonly known as: ADDERALL Take 20 mg by mouth daily as needed (attention/focusing).   MELATONIN PO Take 40 mg by mouth at bedtime.   mesalamine 1000 MG suppository Commonly known as: CANASA Place 1,000 mg rectally every Wednesday.   naproxen 500 MG tablet Commonly known as: NAPROSYN Take 500 mg by mouth at bedtime.   oxyCODONE 5 MG immediate release tablet Commonly known as: Oxy IR/ROXICODONE Take 1 tablet (5 mg total) by mouth every 6 (six) hours as needed for moderate pain.   PRENATAL PO Take 1 tablet by mouth daily.          Follow-up Information     Kinsinger, Arta Bruce, MD Follow up in 2 week(s).   Specialty: General Surgery Contact information: [address] [place]  [address] [phone]                 Signed: Wellington Hampshire, [facility] 05/22/2021, 2:17 PM Please see Amion for pager number during day hours 7:00am-4:30pm

## 2021-06-25 DIAGNOSIS — Z79899 Other long term (current) drug therapy: Secondary | ICD-10-CM | POA: Diagnosis not present

## 2021-06-25 DIAGNOSIS — Z9081 Acquired absence of spleen: Secondary | ICD-10-CM | POA: Diagnosis not present

## 2021-06-25 DIAGNOSIS — F902 Attention-deficit hyperactivity disorder, combined type: Secondary | ICD-10-CM | POA: Diagnosis not present

## 2021-07-21 ENCOUNTER — Telehealth: Payer: 59 | Admitting: Nurse Practitioner

## 2021-07-21 DIAGNOSIS — R399 Unspecified symptoms and signs involving the genitourinary system: Secondary | ICD-10-CM

## 2021-07-21 MED ORDER — CEPHALEXIN 500 MG PO CAPS: 500.0000 mg | ORAL_CAPSULE | Freq: Two times a day (BID) | ORAL | 0 refills | Status: DC

## 2021-07-21 NOTE — Progress Notes (Signed)

## 2021-08-05 ENCOUNTER — Telehealth: Payer: 59 | Admitting: Family Medicine

## 2021-08-05 DIAGNOSIS — J111 Influenza due to unidentified influenza virus with other respiratory manifestations: Secondary | ICD-10-CM | POA: Diagnosis not present

## 2021-08-05 DIAGNOSIS — Z20828 Contact with and (suspected) exposure to other viral communicable diseases: Secondary | ICD-10-CM

## 2021-08-05 MED ORDER — BENZONATATE 200 MG PO CAPS: 200.0000 mg | ORAL_CAPSULE | Freq: Two times a day (BID) | ORAL | 0 refills | Status: DC | PRN

## 2021-08-05 MED ORDER — OSELTAMIVIR PHOSPHATE 75 MG PO CAPS: 75.0000 mg | ORAL_CAPSULE | Freq: Two times a day (BID) | ORAL | 0 refills | Status: AC

## 2021-08-05 NOTE — Progress Notes (Signed)
E visit for Flu like symptoms ?  ?We are sorry that you are not feeling well.  Here is how we plan to help! ?Based on what you have shared with me it looks like you may have a respiratory virus that may be influenza. ? ?Influenza or ?the flu? is   an infection caused by a respiratory virus. The flu virus is highly contagious and persons who did not receive their yearly flu vaccination may ?catch? the flu from close contact. ? ?We have anti-viral medications to treat the viruses that cause this infection. They are not a ?cure? and only shorten the course of the infection. These prescriptions are most effective when they are given within the first 2 days of ?flu? symptoms. Antiviral medication are indicated if you have a high risk of complications from the flu. You should  also consider an antiviral medication if you are in close contact with someone who is at risk. These medications can help patients avoid complications from the flu  but have side effects that you should know. Possible side effects from Tamiflu or oseltamivir include nausea, vomiting, diarrhea, dizziness, headaches, eye redness, sleep problems or other respiratory symptoms. ?You should not take Tamiflu if you have an allergy to oseltamivir or any to the ingredients in Tamiflu. ? ?Based upon your symptoms and potential risk factors I have prescribed Oseltamivir (Tamiflu).  It has been sent to your designated pharmacy.  You will take one 75 mg capsule orally twice a day for the next 5 days. ? ?ANYONE WHO HAS FLU SYMPTOMS SHOULD: ?Stay home. The flu is highly contagious and going out or to work exposes others! ?Be sure to drink plenty of fluids. Water is fine as well as fruit juices, sodas and electrolyte beverages. You may want to stay away from caffeine or alcohol. If you are nauseated, try taking small sips of liquids. How do you know if you are getting enough fluid? Your urine should be a pale yellow or almost colorless. ?Get rest. ?Taking a steamy  shower or using a humidifier may help nasal congestion and ease sore throat pain. Using a saline nasal spray works much the same way. ?Cough drops, hard candies and sore throat lozenges may ease your cough. ?Line up a caregiver. Have someone check on you regularly. ? ? ?GET HELP RIGHT AWAY IF: ?You cannot keep down liquids or your medications. ?You become short of breath ?Your fell like you are going to pass out or loose consciousness. ?Your symptoms persist after you have completed your treatment plan ?MAKE SURE YOU  ?Understand these instructions. ?Will watch your condition. ?Will get help right away if you are not doing well or get worse. ? ?Your e-visit answers were reviewed by a board certified advanced clinical practitioner to complete your personal care plan.  Depending on the condition, your plan could have included both over the counter or prescription medications. ? ?If there is a problem please reply  once you have received a response from your provider. ? ?Your safety is important to [place].  If you have drug allergies check your prescription carefully.   ? ?You can use MyChart to ask questions about today?s visit, request a non-urgent call back, or ask for a work or school excuse for 24 hours related to this e-Visit. If it has been greater than 24 hours you will need to follow up with your provider, or enter a new e-Visit to address those concerns. ? ?You will get an e-mail in the next  two days asking about your experience.  I hope that your e-visit has been valuable and will speed your recovery. Thank you for using e-visits.  ? ?I have provided 5 minutes of non face to face time during this encounter for chart review and documentation.   ?

## 2021-08-31 ENCOUNTER — Other Ambulatory Visit: Payer: Self-pay

## 2021-08-31 ENCOUNTER — Observation Stay (HOSPITAL_COMMUNITY)
Admission: EM | Admit: 2021-08-31 | Discharge: 2021-09-01 | Disposition: A | Discharge status: Home / Self Care | Payer: 59 | Attending: Internal Medicine | Admitting: Internal Medicine

## 2021-08-31 ENCOUNTER — Encounter (HOSPITAL_COMMUNITY): Payer: Self-pay | Admitting: Emergency Medicine

## 2021-08-31 ENCOUNTER — Emergency Department (HOSPITAL_COMMUNITY): Payer: 59

## 2021-08-31 DIAGNOSIS — R5383 Other fatigue: Secondary | ICD-10-CM | POA: Diagnosis not present

## 2021-08-31 DIAGNOSIS — K5190 Ulcerative colitis, unspecified, without complications: Secondary | ICD-10-CM | POA: Diagnosis present

## 2021-08-31 DIAGNOSIS — R202 Paresthesia of skin: Secondary | ICD-10-CM | POA: Diagnosis present

## 2021-08-31 DIAGNOSIS — R29818 Other symptoms and signs involving the nervous system: Secondary | ICD-10-CM | POA: Diagnosis not present

## 2021-08-31 DIAGNOSIS — R531 Weakness: Secondary | ICD-10-CM | POA: Diagnosis not present

## 2021-08-31 DIAGNOSIS — R29810 Facial weakness: Secondary | ICD-10-CM | POA: Diagnosis not present

## 2021-08-31 DIAGNOSIS — K51919 Ulcerative colitis, unspecified with unspecified complications: Secondary | ICD-10-CM | POA: Diagnosis not present

## 2021-08-31 DIAGNOSIS — G930 Cerebral cysts: Secondary | ICD-10-CM | POA: Diagnosis not present

## 2021-08-31 DIAGNOSIS — Q046 Congenital cerebral cysts: Secondary | ICD-10-CM

## 2021-08-31 DIAGNOSIS — D734 Cyst of spleen: Secondary | ICD-10-CM | POA: Diagnosis present

## 2021-08-31 DIAGNOSIS — R0789 Other chest pain: Secondary | ICD-10-CM | POA: Diagnosis present

## 2021-08-31 DIAGNOSIS — G459 Transient cerebral ischemic attack, unspecified: Secondary | ICD-10-CM | POA: Diagnosis not present

## 2021-08-31 DIAGNOSIS — R200 Anesthesia of skin: Secondary | ICD-10-CM | POA: Diagnosis not present

## 2021-08-31 DIAGNOSIS — Z79899 Other long term (current) drug therapy: Secondary | ICD-10-CM | POA: Diagnosis not present

## 2021-08-31 DIAGNOSIS — G514 Facial myokymia: Secondary | ICD-10-CM | POA: Diagnosis present

## 2021-08-31 HISTORY — DX: Ulcerative colitis, unspecified, without complications: K51.90

## 2021-08-31 LAB — COMPREHENSIVE METABOLIC PANEL
ALT: 18 U/L (ref 0–44)
AST: 17 U/L (ref 15–41)
Albumin: 4.2 g/dL (ref 3.5–5.0)
Alkaline Phosphatase: 59 U/L (ref 38–126)
Anion gap: 8 (ref 5–15)
BUN: 17 mg/dL (ref 6–20)
CO2: 24 mmol/L (ref 22–32)
Calcium: 9.3 mg/dL (ref 8.9–10.3)
Chloride: 106 mmol/L (ref 98–111)
Creatinine, Ser: 0.79 mg/dL (ref 0.44–1.00)
GFR, Estimated: >60 mL/min (ref >60)
Glucose, Bld: 97 mg/dL (ref 70–99)
Potassium: 3.7 mmol/L (ref 3.5–5.1)
Sodium: 138 mmol/L (ref 135–145)
Total Bilirubin: 0.6 mg/dL (ref 0.3–1.2)
Total Protein: 7.1 g/dL (ref 6.5–8.1)

## 2021-08-31 LAB — URINALYSIS, ROUTINE W REFLEX MICROSCOPIC
Bacteria, UA: NONE SEEN
Bilirubin Urine: NEGATIVE
Glucose, UA: NEGATIVE mg/dL
Hgb urine dipstick: NEGATIVE
Ketones, ur: NEGATIVE mg/dL
Leukocytes,Ua: NEGATIVE
Nitrite: NEGATIVE
Protein, ur: NEGATIVE mg/dL
Specific Gravity, Urine: 1.014 (ref 1.005–1.030)
pH: 7 (ref 5.0–8.0)

## 2021-08-31 LAB — CBC
HCT: 39 % (ref 36.0–46.0)
Hemoglobin: 13.6 g/dL (ref 12.0–15.0)
MCH: 31.6 pg (ref 26.0–34.0)
MCHC: 34.9 g/dL (ref 30.0–36.0)
MCV: 90.5 fL (ref 80.0–100.0)
Platelets: 388 10*3/uL (ref 150–400)
RBC: 4.31 MIL/uL (ref 3.87–5.11)
RDW: 13.2 % (ref 11.5–15.5)
WBC: 8.8 10*3/uL (ref 4.0–10.5)
nRBC: 0 % (ref 0.0–0.2)

## 2021-08-31 LAB — I-STAT BETA HCG BLOOD, ED (MC, WL, AP ONLY): I-stat hCG, quantitative: <5 m[IU]/mL (ref <5)

## 2021-08-31 LAB — CBG MONITORING, ED: Glucose-Capillary: 101 mg/dL — ABNORMAL HIGH (ref 70–99)

## 2021-08-31 IMAGING — CT CT HEAD W/O CM
3 series · 15 of 47 positions shown, 18 images · non-contrast
Comparison: None Available.

CLINICAL DATA: Neuro deficit, acute, stroke suspected. Left-sided
weakness, numbness.



[Series 3: head 5.0 h30s · axial · 0.41mm/px · z∈[+129,+259]mm · 9 of 32 slices shown, 12 images]
[im 3/32  brain]
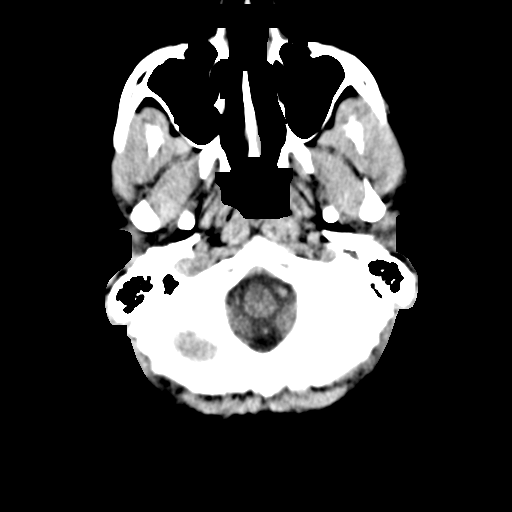
[im 3/32  bone]
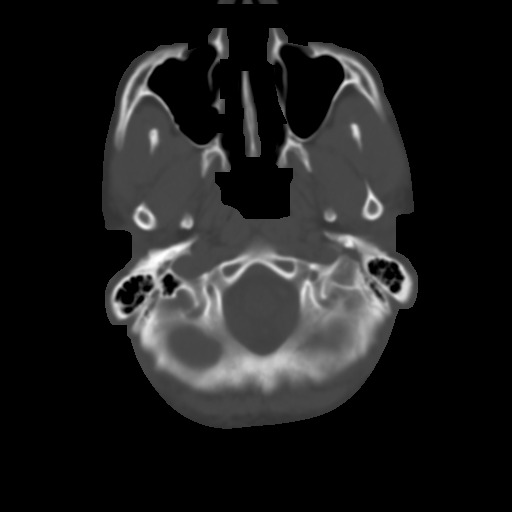
[im 6/32  brain]
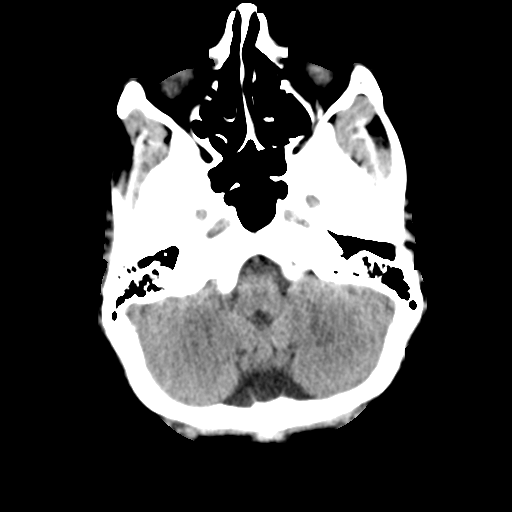
[im 9/32  brain]
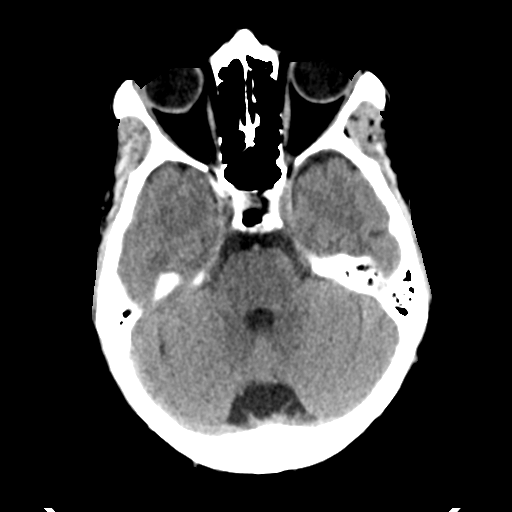
[im 12/32  brain]
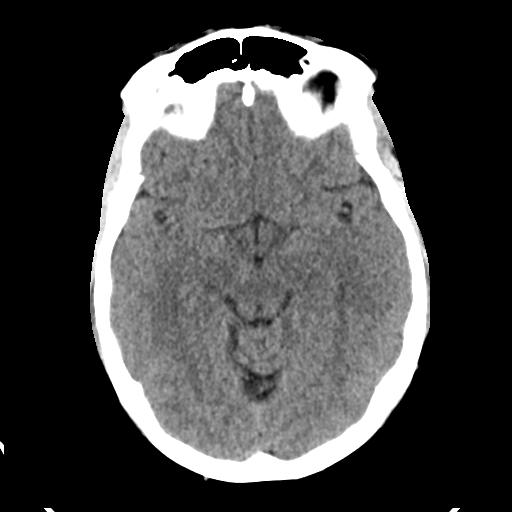
[im 17/32  brain]
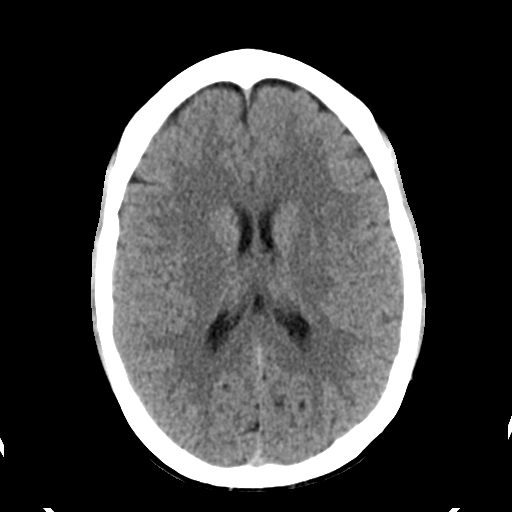
[im 17/32  bone]
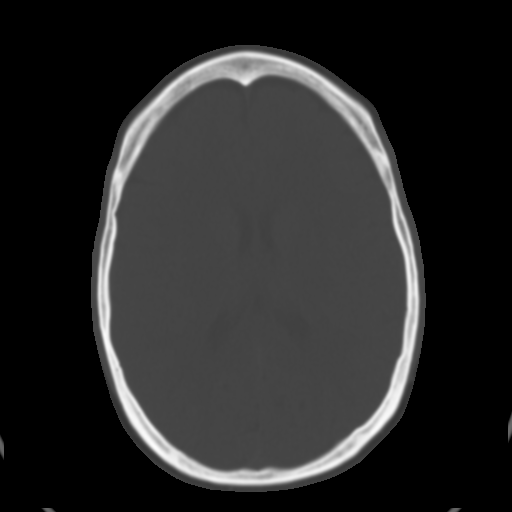
[im 20/32  brain]
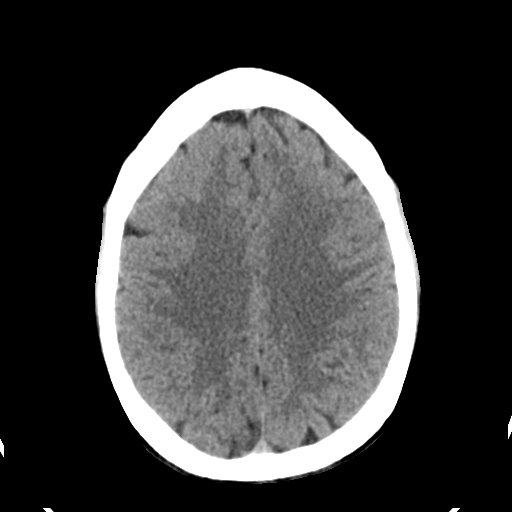
[im 23/32  brain]
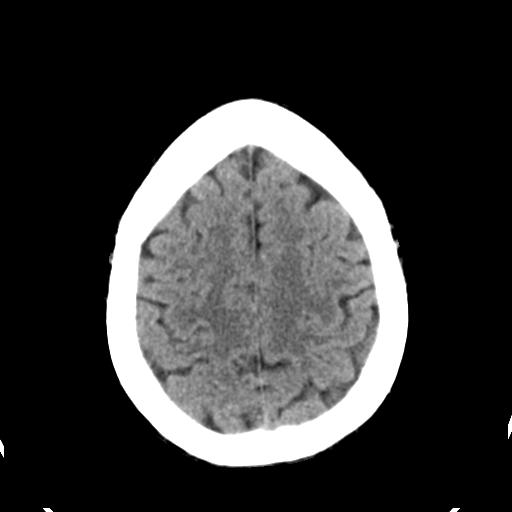
[im 26/32  brain]
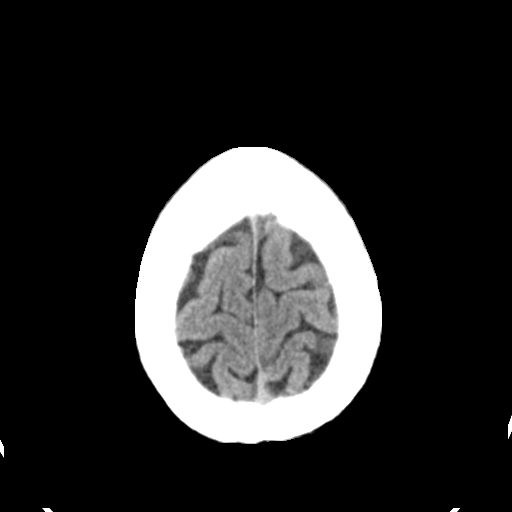
[im 29/32  brain]
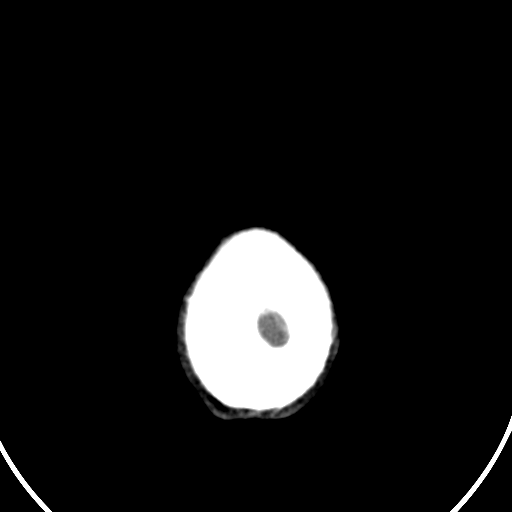
[im 29/32  bone]
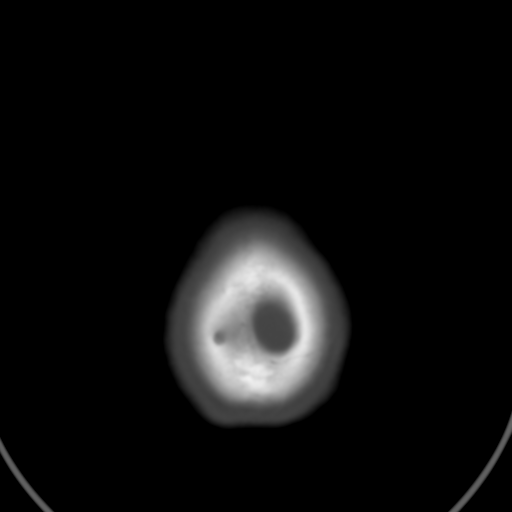

[Series 5: head 3.0 mpr cor · coronal · 0.29mm/px · 3 of 67 slices shown]
[im 23/67  brain]
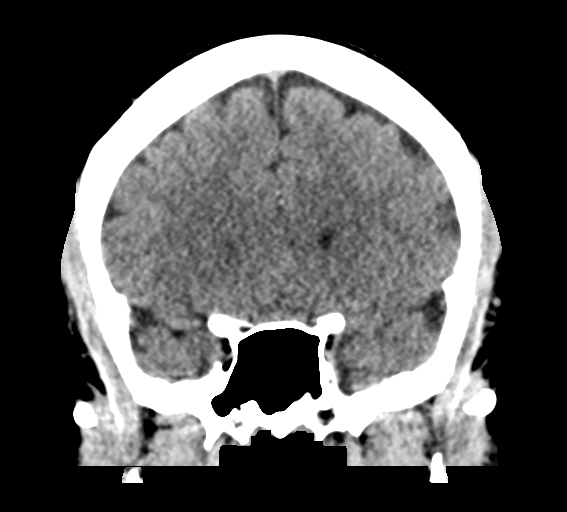
[im 30/67  brain]
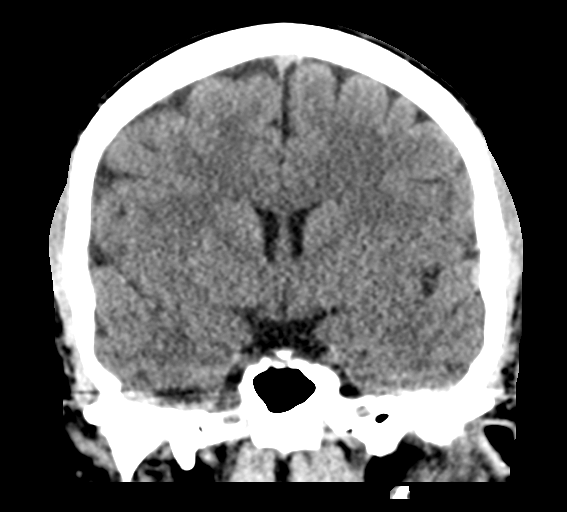
[im 37/67  brain]
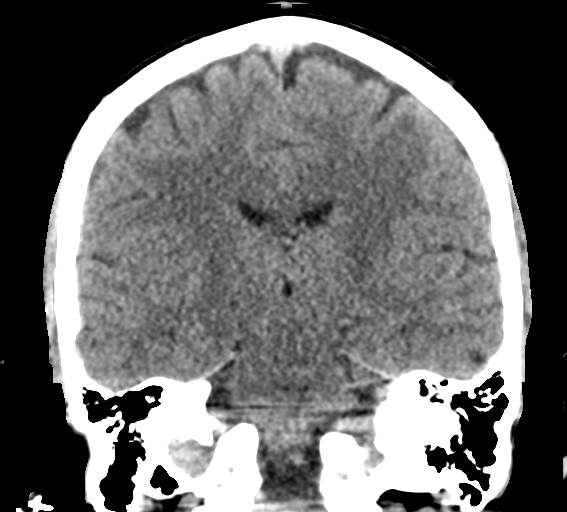

[Series 6: head 3.0 mpr sag · sagittal · 0.30mm/px · 3 of 56 slices shown]
[im 19/56  brain]
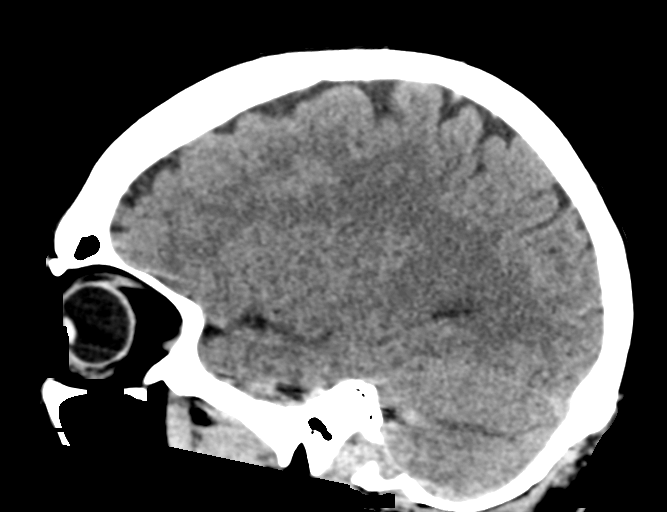
[im 28/56  brain]
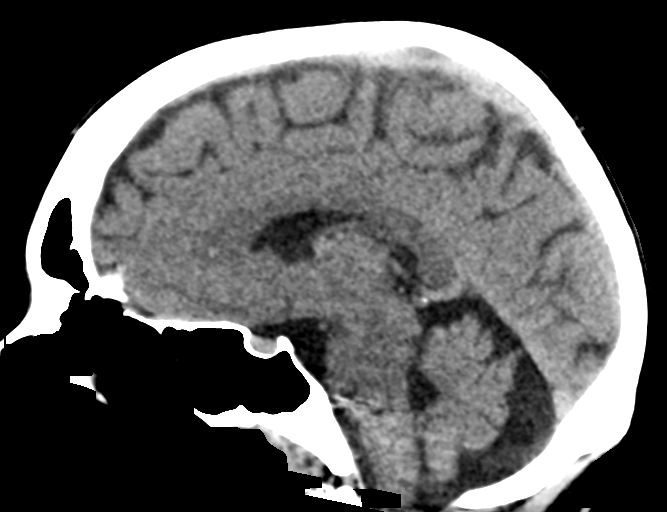
[im 37/56  brain]
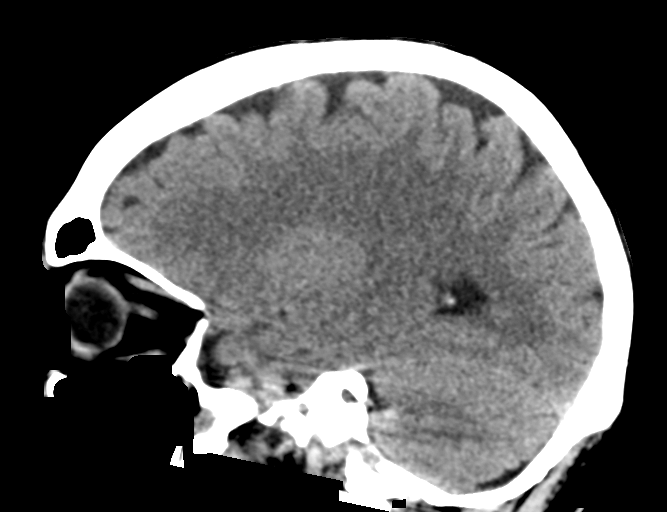

[15 of 47 positions shown; findings below may reference images not displayed]

FINDINGS: Brain: 4 mm hyperdensity seen in the anterior 3rd ventricle
compatible with colloid cyst. No hydrocephalus. No acute infarct or
hemorrhage.

Vascular: No hyperdense vessel or unexpected calcification.

Skull: No acute calvarial abnormality.

Sinuses/Orbits: No acute findings

Other: None
IMPRESSION: 4 mm colloid cyst.  No hydrocephalus.

No acute intracranial abnormality.

## 2021-08-31 NOTE — ED Triage Notes (Signed)
Pt brought to ED by [facility] with c/o "left sided weakness" that was present upon awakening this morning. Pt states she noticed weakness to left side around 4pm, and at approximately 7 pm pt states left side of her face was weak and she was unable to hold a smile. Symptoms to face now resolve. Pt appears stable at time of triage. EMS reports MEND and BEFAST-G was negative.   EMS Vitals BP 118/60 HR 90 SPO2 100% RA CBG 111

## 2021-08-31 NOTE — ED Provider Triage Note (Signed)
Emergency Medicine Provider Triage Evaluation Note  Cynthia Campbell , a 36 y.o. female  was evaluated in triage.  Patient presents with a concern for TIA.  This morning when she woke up she felt as though the left side of her body were weak.  She said it started with some tension in her left pec and bicep.  Throughout the morning she kept having weakness on the side.  Then said that she was trying to smile in the mirror and that her face was not drooping but that her left side got very tired and she was unable to sustain the smile.  No history of TIA/CVA.  Has had a splenectomy but no other medical history outside of ADHD.  Currently asymptomatic.  Review of Systems  Positive: Left-sided tension and weakness Negative: Visual disturbance, headache, numbness  Physical Exam  BP 133/78   Pulse 78   Temp 97.9 F (36.6 C) (Oral)   SpO2 100%  Gen:   Awake, no distress   Resp:  Normal effort  MSK:   Moves extremities without difficulty  Other:  Cranial nerves II through XII intact.  Normal range of motion of all extremities, 5 out of 5 strength bilateral upper and lower.  Medical Decision Making  Medically screening exam initiated at 9:30 PM.  Appropriate orders placed.  Cynthia Campbell was informed that the remainder of the evaluation will be completed by another provider, this initial triage assessment does not replace that evaluation, and the importance of remaining in the ED until their evaluation is complete.    Very anxious that she may have had a TIA.  Requesting head CT.  Ordered.   Cynthia Hammock, PA-C 08/31/21 2135

## 2021-09-01 ENCOUNTER — Observation Stay (HOSPITAL_COMMUNITY): Payer: 59

## 2021-09-01 ENCOUNTER — Encounter (HOSPITAL_COMMUNITY): Payer: Self-pay | Admitting: Internal Medicine

## 2021-09-01 DIAGNOSIS — R202 Paresthesia of skin: Secondary | ICD-10-CM | POA: Diagnosis not present

## 2021-09-01 DIAGNOSIS — K5190 Ulcerative colitis, unspecified, without complications: Secondary | ICD-10-CM

## 2021-09-01 DIAGNOSIS — D734 Cyst of spleen: Secondary | ICD-10-CM | POA: Diagnosis not present

## 2021-09-01 DIAGNOSIS — Q046 Congenital cerebral cysts: Secondary | ICD-10-CM | POA: Diagnosis not present

## 2021-09-01 DIAGNOSIS — G514 Facial myokymia: Secondary | ICD-10-CM

## 2021-09-01 DIAGNOSIS — R5383 Other fatigue: Secondary | ICD-10-CM | POA: Diagnosis not present

## 2021-09-01 DIAGNOSIS — R0789 Other chest pain: Secondary | ICD-10-CM | POA: Diagnosis not present

## 2021-09-01 DIAGNOSIS — R200 Anesthesia of skin: Secondary | ICD-10-CM | POA: Diagnosis not present

## 2021-09-01 DIAGNOSIS — Z79899 Other long term (current) drug therapy: Secondary | ICD-10-CM | POA: Diagnosis not present

## 2021-09-01 DIAGNOSIS — R531 Weakness: Secondary | ICD-10-CM | POA: Diagnosis not present

## 2021-09-01 DIAGNOSIS — K51919 Ulcerative colitis, unspecified with unspecified complications: Secondary | ICD-10-CM

## 2021-09-01 DIAGNOSIS — G930 Cerebral cysts: Secondary | ICD-10-CM | POA: Diagnosis not present

## 2021-09-01 LAB — TSH: TSH: 1.536 u[IU]/mL (ref 0.350–4.500)

## 2021-09-01 LAB — URINALYSIS, COMPLETE (UACMP) WITH MICROSCOPIC
Bacteria, UA: NONE SEEN
Bilirubin Urine: NEGATIVE
Glucose, UA: NEGATIVE mg/dL
Hgb urine dipstick: NEGATIVE
Ketones, ur: NEGATIVE mg/dL
Nitrite: NEGATIVE
Protein, ur: NEGATIVE mg/dL
Specific Gravity, Urine: 1.013 (ref 1.005–1.030)
pH: 8 (ref 5.0–8.0)

## 2021-09-01 LAB — LIPID PANEL
Cholesterol: 141 mg/dL (ref 0–200)
HDL: 71 mg/dL (ref >40)
LDL Cholesterol: 63 mg/dL (ref 0–99)
Total CHOL/HDL Ratio: 2 RATIO
Triglycerides: 35 mg/dL (ref <150)
VLDL: 7 mg/dL (ref 0–40)

## 2021-09-01 LAB — PREGNANCY, URINE: Preg Test, Ur: NEGATIVE

## 2021-09-01 LAB — RAPID URINE DRUG SCREEN, HOSP PERFORMED
Amphetamines: POSITIVE — AB
Barbiturates: NOT DETECTED
Benzodiazepines: NOT DETECTED
Cocaine: NOT DETECTED
Opiates: NOT DETECTED
Tetrahydrocannabinol: NOT DETECTED

## 2021-09-01 LAB — HEMOGLOBIN A1C
Hgb A1c MFr Bld: 4.9 % (ref 4.8–5.6)
Mean Plasma Glucose: 93.93 mg/dL

## 2021-09-01 LAB — HIV ANTIBODY (ROUTINE TESTING W REFLEX): HIV Screen 4th Generation wRfx: NONREACTIVE

## 2021-09-01 LAB — MAGNESIUM: Magnesium: 2.1 mg/dL (ref 1.7–2.4)

## 2021-09-01 LAB — SEDIMENTATION RATE: Sed Rate: 19 mm/hr (ref 0–22)

## 2021-09-01 IMAGING — MR MR MRA HEAD W/O CM
2 series · 18 of 48 positions shown · IV contrast (gadavist)
Comparison: Head CT from yesterday
COMPARISON: Head CT from yesterday

Addendum:
CLINICAL DATA: Left-sided paresthesia

EXAM:
MRI HEAD WITHOUT AND WITH CONTRAST
MRA HEAD WITHOUT CONTRAST
MRA NECK WITHOUT AND WITH CONTRAST
TECHNIQUE: Multiplanar, multiecho pulse sequences of the brain and surrounding
structures were obtained without and with intravenous contrast.
Angiographic images of the Circle of Willis were obtained using MRA
technique without intravenous contrast. Angiographic images of the
neck were obtained using MRA technique without and with intravenous
contrast. Carotid stenosis measurements (when applicable) are
obtained utilizing NASCET criteria, using the distal internal
carotid diameter as the denominator.
CONTRAST:  7mL GADAVIST GADOBUTROL 1 MMOL/ML IV SOLN

[Series 4: ax (id) · axial · 1.0mm · 0.43mm/px · z∈[-116,-8]mm · 17 of 227 slices shown]
[im 1/227]
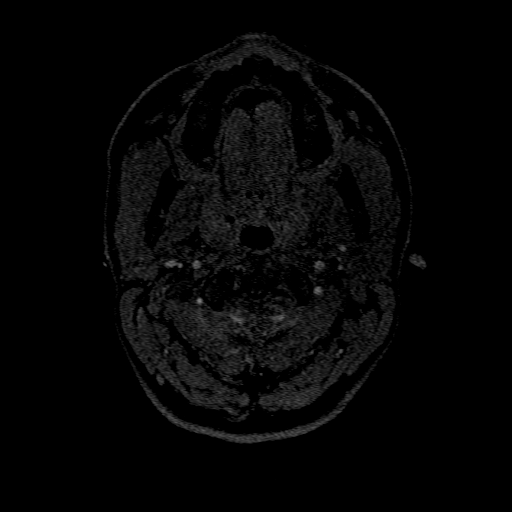
[im 5/227]
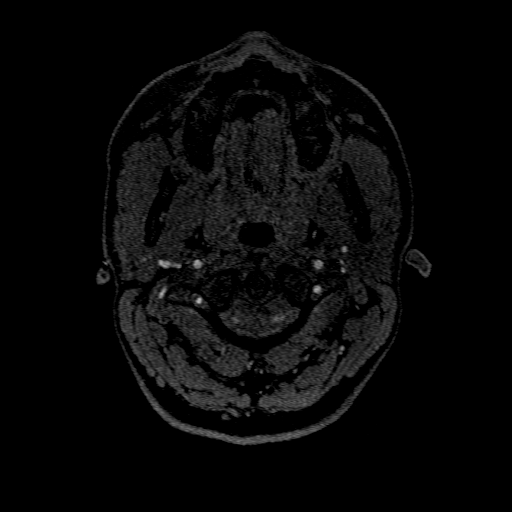
[im 10/227]
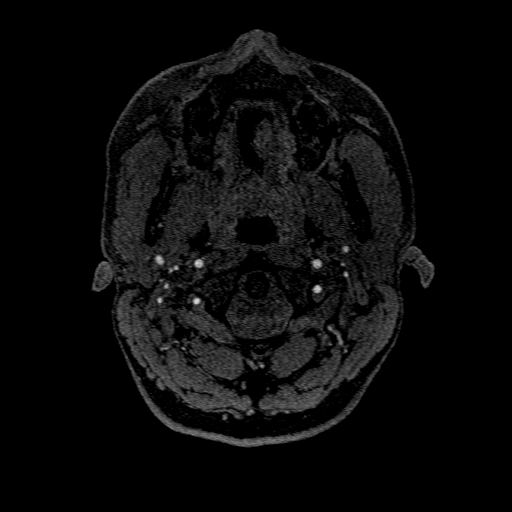
[im 15/227]
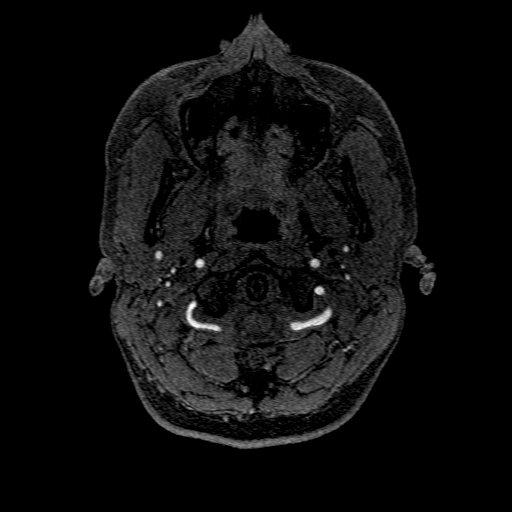
[im 20/227]
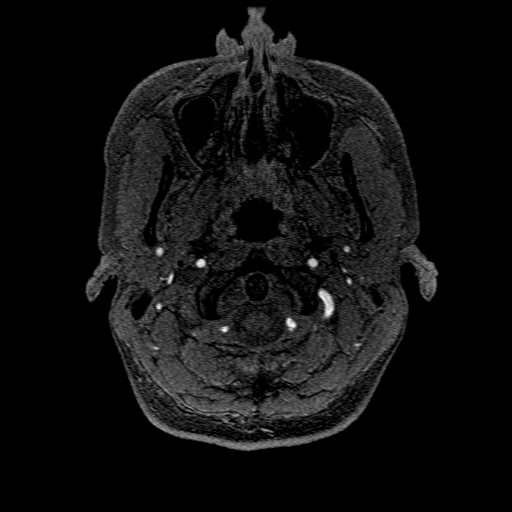
[im 25/227]
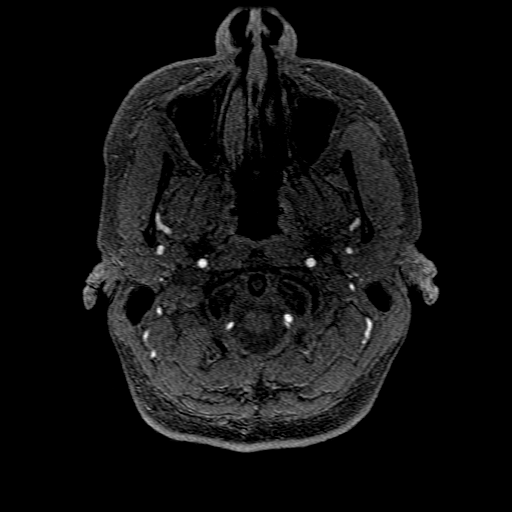
[im 30/227]
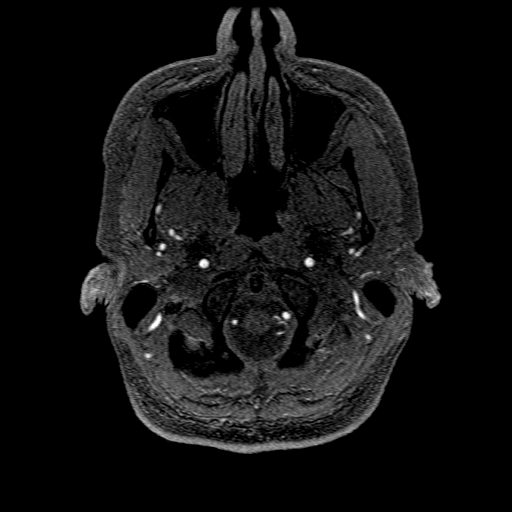
[im 35/227]
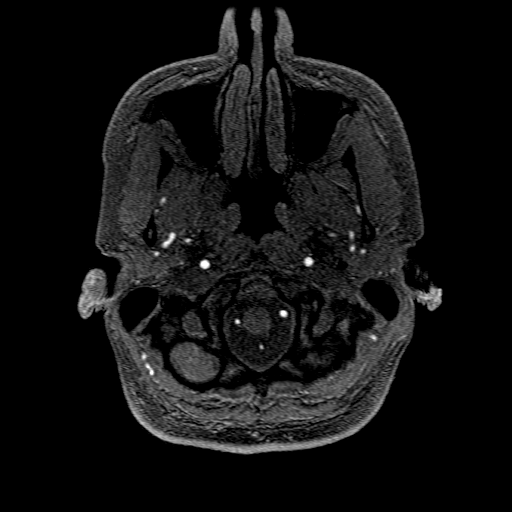
[im 40/227]
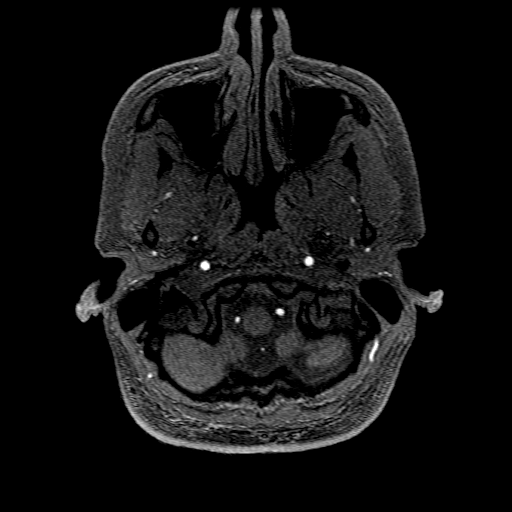
[im 69/227]
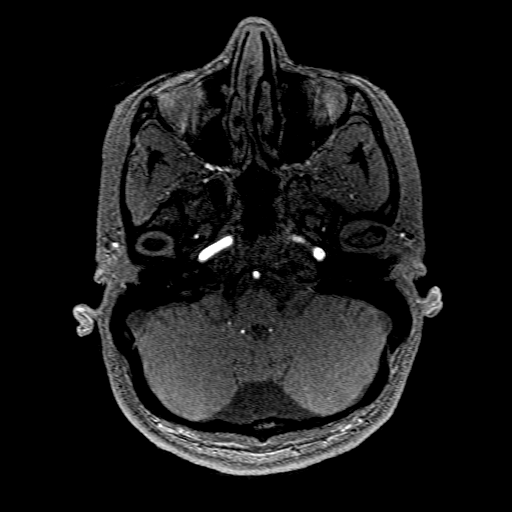
[im 99/227]
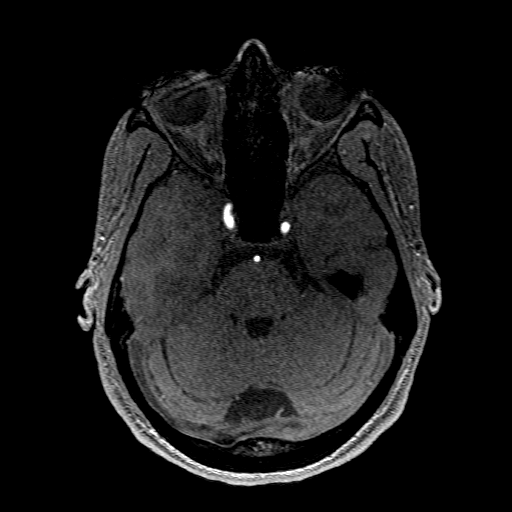
[im 113/227]
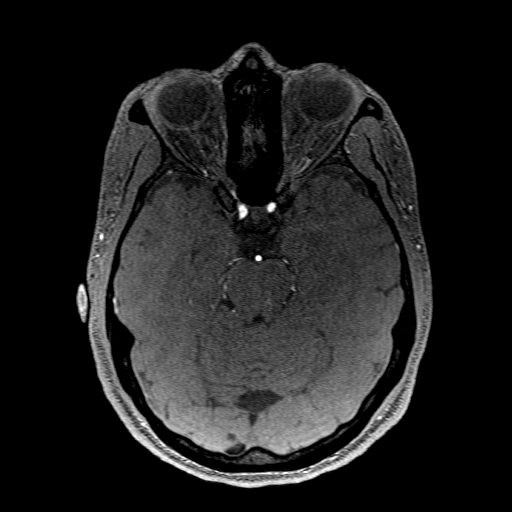
[im 128/227]
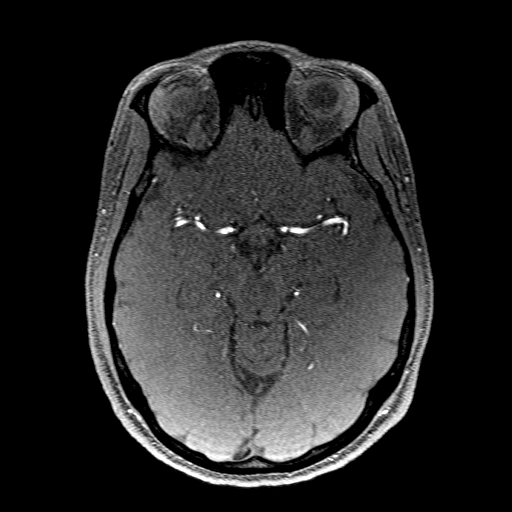
[im 158/227]
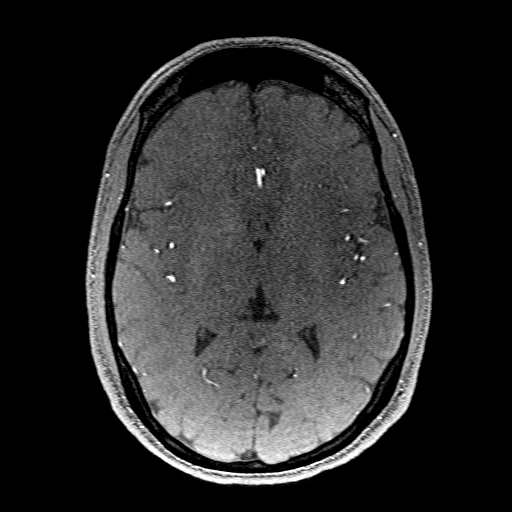
[im 187/227]
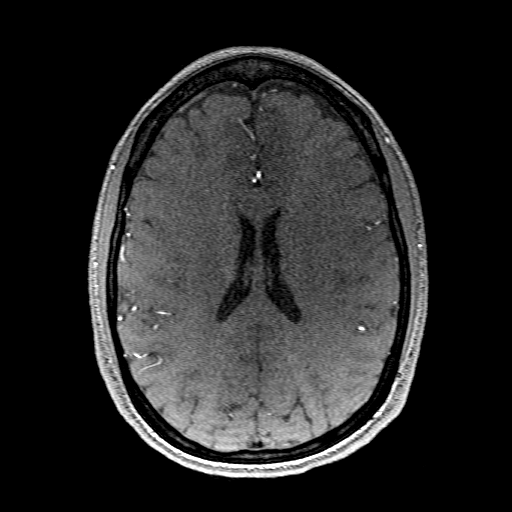
[im 192/227]
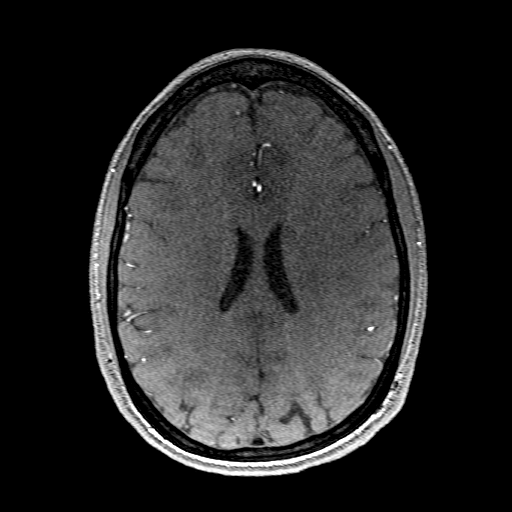
[im 217/227]
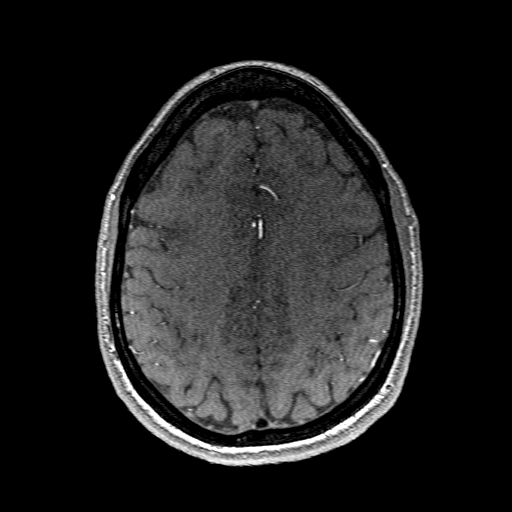

[Series 401: pjn:ax (id) · sagittal · 1.0mm · 0.43mm/px · 1 of 6 slices shown]
[im 1/6]
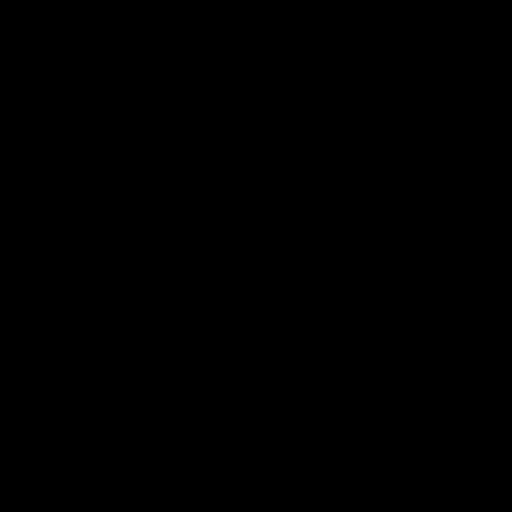

[18 of 48 positions shown; findings below may reference images not displayed]

FINDINGS: MRI HEAD FINDINGS

Brain: No acute infarction, hemorrhage, hydrocephalus, extra-axial
collection or mass lesion. Minimal FLAIR hyperintensity around the
lateral ventricles. No generalized white matter disease, atrophy, or
abnormal enhancement.

Vascular: Normal flow voids and vascular enhancements

Skull and upper cervical spine: Normal marrow signal

Sinuses/Orbits: Negative

Other: Nonenhancing retention cyst in the right nasopharynx

MRA HEAD FINDINGS

Vessels are smooth and widely patent. Negative for aneurysm or
vascular malformation.

MRA NECK FINDINGS

Antegrade flow in the carotid and vertebral arteries by
time-of-flight. No incidental finding in the soft tissues on neck
mask. Normal appearance of the arch and great vessels. The carotid
and vertebral arteries are smoothly contoured and diffusely patent.
IMPRESSION: Brain MRI:

No explanation for symptoms. No infarct or generalized white matter
disease.

MRA of the head and neck:

Normal.

ADDENDUM:
Omitted finding of known colloid cyst measuring 4 mm. No
hydrocephalus.

*** End of Addendum ***
FINDINGS: MRI HEAD FINDINGS

Brain: No acute infarction, hemorrhage, hydrocephalus, extra-axial
collection or mass lesion. Minimal FLAIR hyperintensity around the
lateral ventricles. No generalized white matter disease, atrophy, or
abnormal enhancement.

Vascular: Normal flow voids and vascular enhancements

Skull and upper cervical spine: Normal marrow signal

Sinuses/Orbits: Negative

Other: Nonenhancing retention cyst in the right nasopharynx

MRA HEAD FINDINGS

Vessels are smooth and widely patent. Negative for aneurysm or
vascular malformation.

MRA NECK FINDINGS

Antegrade flow in the carotid and vertebral arteries by
time-of-flight. No incidental finding in the soft tissues on neck
mask. Normal appearance of the arch and great vessels. The carotid
and vertebral arteries are smoothly contoured and diffusely patent.
IMPRESSION: Brain MRI:

No explanation for symptoms. No infarct or generalized white matter
disease.

MRA of the head and neck:

Normal.

## 2021-09-01 IMAGING — MR MR HEAD WO/W CM
6 of 12 series · 26 of 48 positions shown · IV contrast (7ML GAD)
Comparison: Head CT from yesterday
COMPARISON: Head CT from yesterday

Addendum:
CLINICAL DATA: Left-sided paresthesia

EXAM:
MRI HEAD WITHOUT AND WITH CONTRAST
MRA HEAD WITHOUT CONTRAST
MRA NECK WITHOUT AND WITH CONTRAST
TECHNIQUE: Multiplanar, multiecho pulse sequences of the brain and surrounding
structures were obtained without and with intravenous contrast.
Angiographic images of the Circle of Willis were obtained using MRA
technique without intravenous contrast. Angiographic images of the
neck were obtained using MRA technique without and with intravenous
contrast. Carotid stenosis measurements (when applicable) are
obtained utilizing NASCET criteria, using the distal internal
carotid diameter as the denominator.
CONTRAST:  7mL GADAVIST GADOBUTROL 1 MMOL/ML IV SOLN

[Series 2: DWI · axial · 3.0mm · 0.94mm/px · z∈[-102,+45]mm · 8 of 100 slices shown (1 of 2)]
[im 1/100]
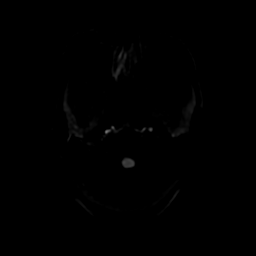
[im 15/100]
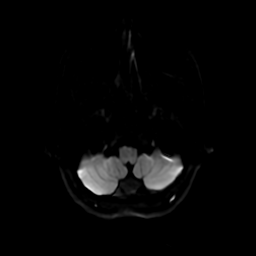
[im 29/100]
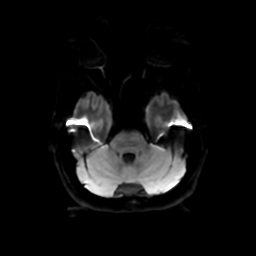
[im 43/100]
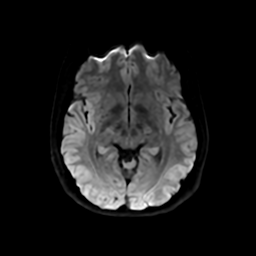
[im 57/100]
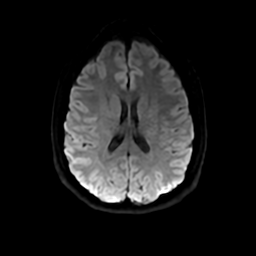
[im 71/100]
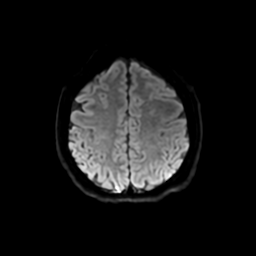
[im 85/100]
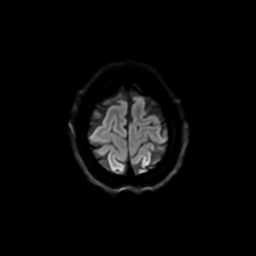
[im 100/100]
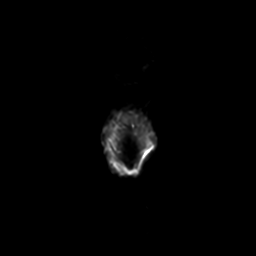

[Series 3: FLAIR · sagittal · 5.0mm · 0.23mm/px · 2 of 22 slices shown (1 of 2)]
[im 1/22]
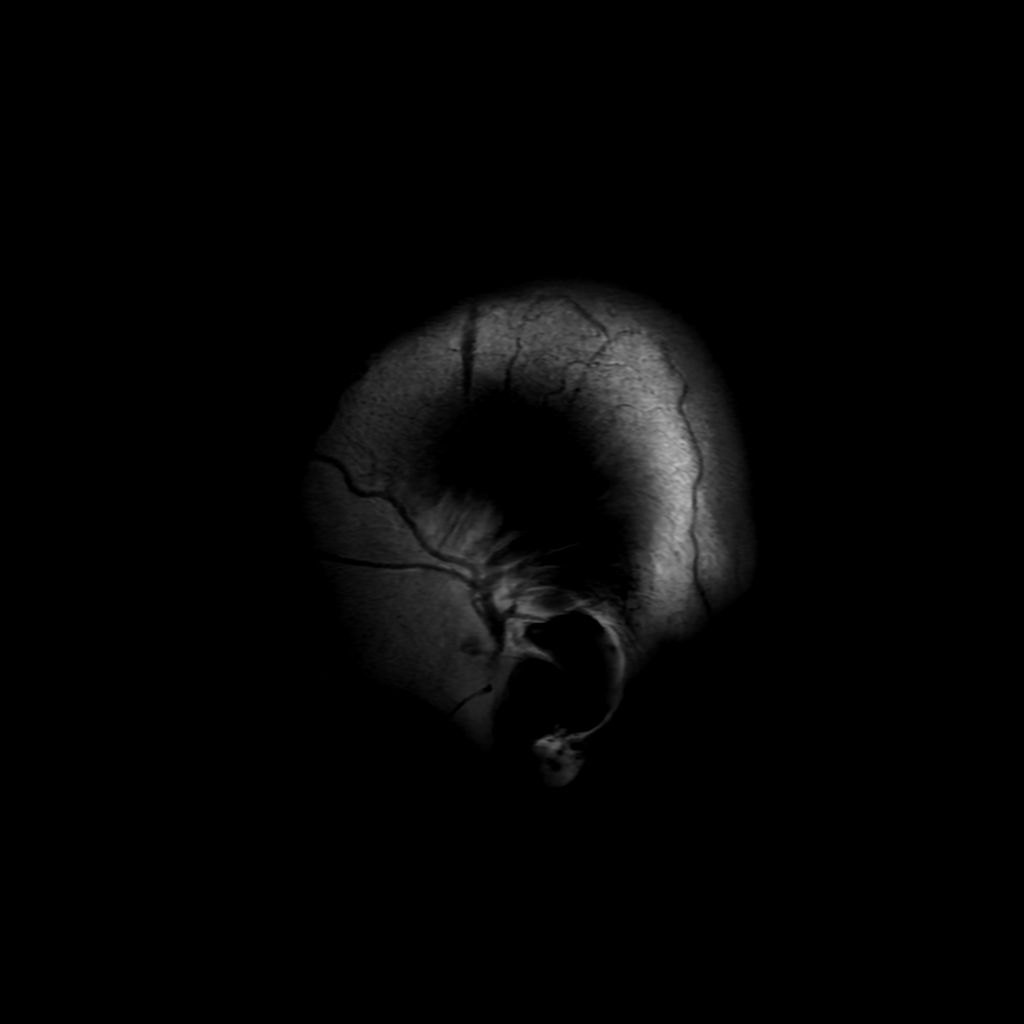
[im 22/22]
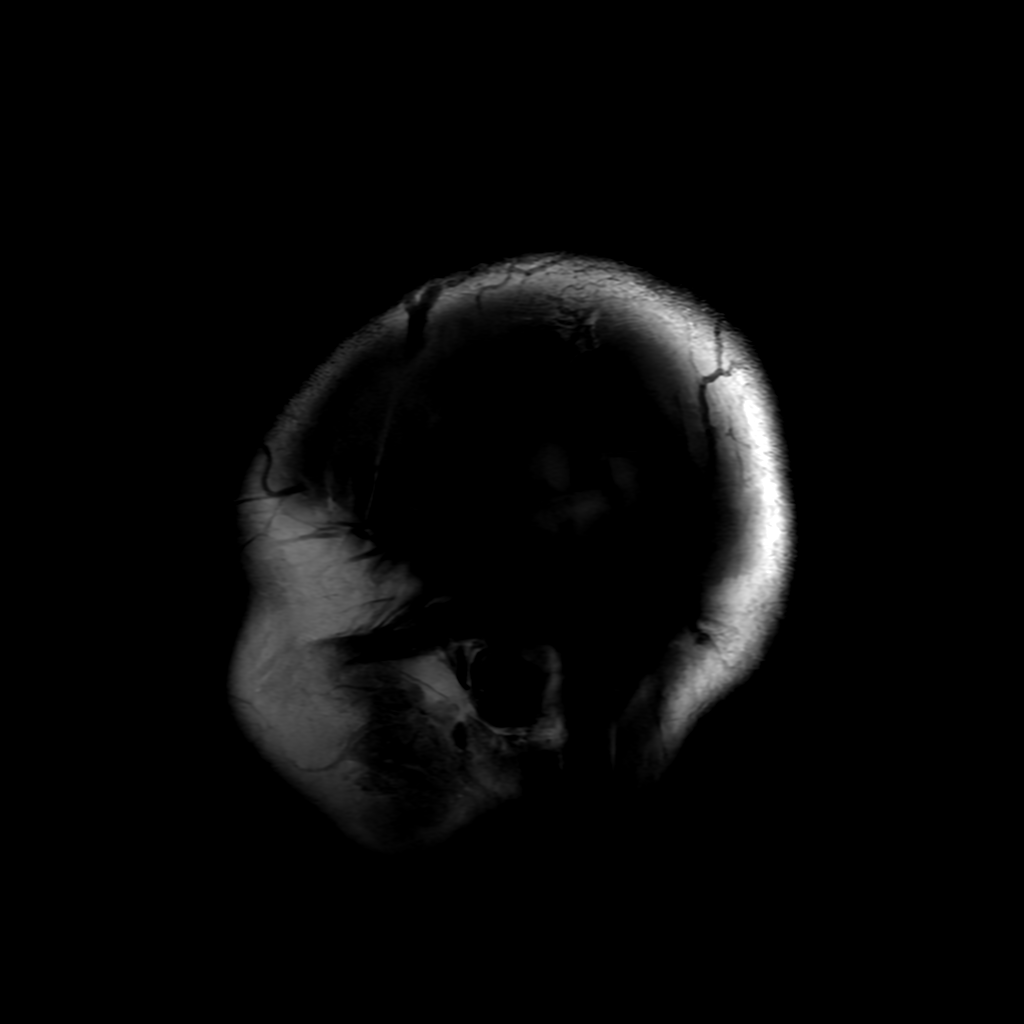

[Series 5: DWI · coronal · 4.0mm · 0.94mm/px · 6 of 72 slices shown (2 of 2)]
[im 1/72]
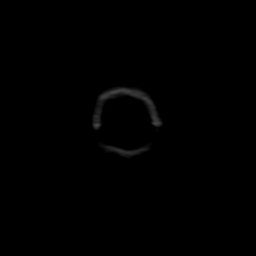
[im 15/72]
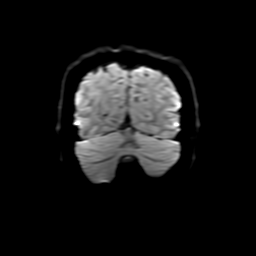
[im 29/72]
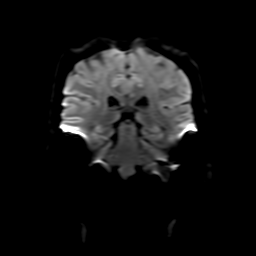
[im 43/72]
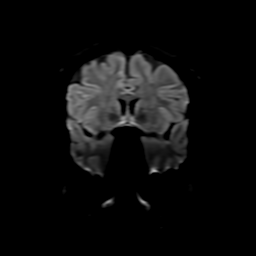
[im 57/72]
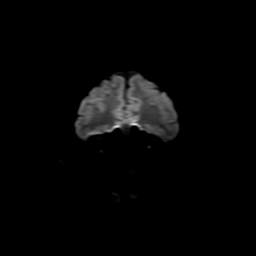
[im 72/72]
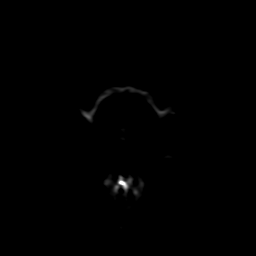

[Series 7: FLAIR · axial · 4.0mm · 0.45mm/px · z∈[-104,+46]mm · 3 of 35 slices shown (2 of 2)]
[im 1/35]
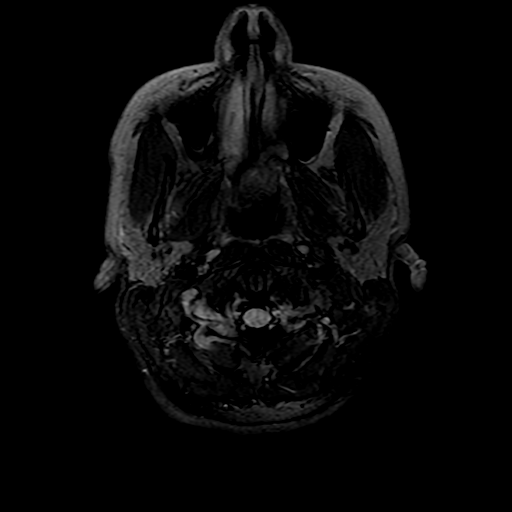
[im 18/35]
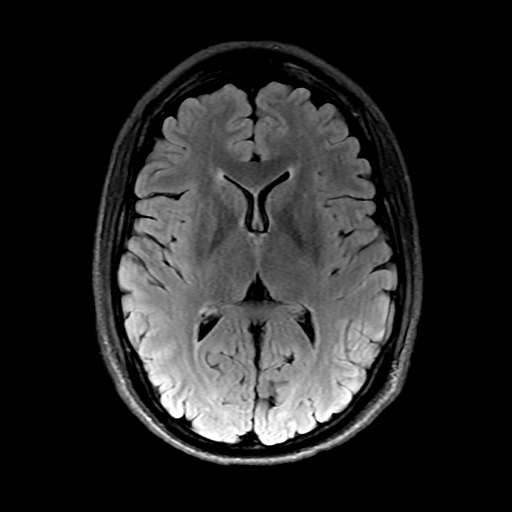
[im 35/35]
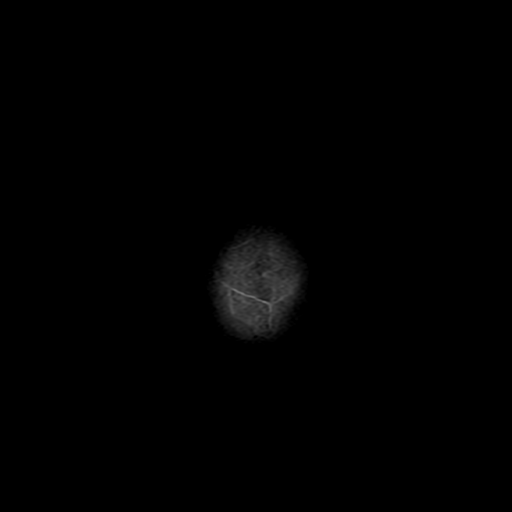

[Series 250: ADC · axial · 3.0mm · 0.94mm/px · z∈[-102,+45]mm · 4 of 50 slices shown (1 of 2)]
[im 1/50]
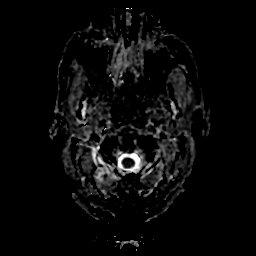
[im 17/50]
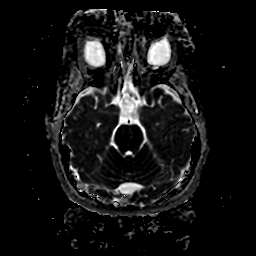
[im 33/50]
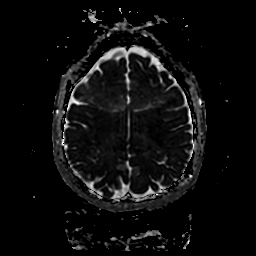
[im 50/50]
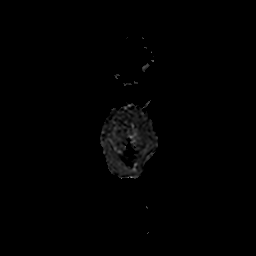

[Series 550: ADC · coronal · 4.0mm · 0.94mm/px · 3 of 36 slices shown (2 of 2)]
[im 1/36]
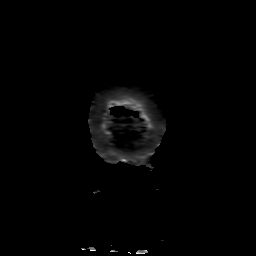
[im 18/36]
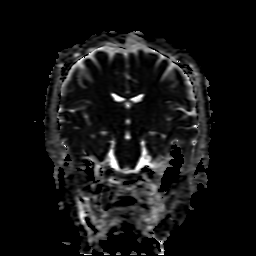
[im 36/36]
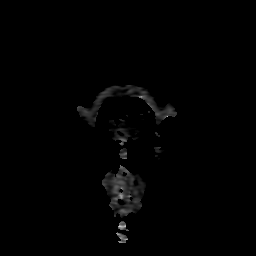

[26 of 48 positions shown; findings below may reference images not displayed]

FINDINGS: MRI HEAD FINDINGS

Brain: No acute infarction, hemorrhage, hydrocephalus, extra-axial
collection or mass lesion. Minimal FLAIR hyperintensity around the
lateral ventricles. No generalized white matter disease, atrophy, or
abnormal enhancement.

Vascular: Normal flow voids and vascular enhancements

Skull and upper cervical spine: Normal marrow signal

Sinuses/Orbits: Negative

Other: Nonenhancing retention cyst in the right nasopharynx

MRA HEAD FINDINGS

Vessels are smooth and widely patent. Negative for aneurysm or
vascular malformation.

MRA NECK FINDINGS

Antegrade flow in the carotid and vertebral arteries by
time-of-flight. No incidental finding in the soft tissues on neck
mask. Normal appearance of the arch and great vessels. The carotid
and vertebral arteries are smoothly contoured and diffusely patent.
IMPRESSION: Brain MRI:

No explanation for symptoms. No infarct or generalized white matter
disease.

MRA of the head and neck:

Normal.

ADDENDUM:
Omitted finding of known colloid cyst measuring 4 mm. No
hydrocephalus.

*** End of Addendum ***
FINDINGS: MRI HEAD FINDINGS

Brain: No acute infarction, hemorrhage, hydrocephalus, extra-axial
collection or mass lesion. Minimal FLAIR hyperintensity around the
lateral ventricles. No generalized white matter disease, atrophy, or
abnormal enhancement.

Vascular: Normal flow voids and vascular enhancements

Skull and upper cervical spine: Normal marrow signal

Sinuses/Orbits: Negative

Other: Nonenhancing retention cyst in the right nasopharynx

MRA HEAD FINDINGS

Vessels are smooth and widely patent. Negative for aneurysm or
vascular malformation.

MRA NECK FINDINGS

Antegrade flow in the carotid and vertebral arteries by
time-of-flight. No incidental finding in the soft tissues on neck
mask. Normal appearance of the arch and great vessels. The carotid
and vertebral arteries are smoothly contoured and diffusely patent.
IMPRESSION: Brain MRI:

No explanation for symptoms. No infarct or generalized white matter
disease.

MRA of the head and neck:

Normal.

## 2021-09-01 IMAGING — MR MR MRA NECK WO/W CM
4 of 7 series · 18 of 48 positions shown · IV contrast (7ML GAD)
Comparison: Head CT from yesterday
COMPARISON: Head CT from yesterday

Addendum:
CLINICAL DATA: Left-sided paresthesia

EXAM:
MRI HEAD WITHOUT AND WITH CONTRAST
MRA HEAD WITHOUT CONTRAST
MRA NECK WITHOUT AND WITH CONTRAST
TECHNIQUE: Multiplanar, multiecho pulse sequences of the brain and surrounding
structures were obtained without and with intravenous contrast.
Angiographic images of the Circle of Willis were obtained using MRA
technique without intravenous contrast. Angiographic images of the
neck were obtained using MRA technique without and with intravenous
contrast. Carotid stenosis measurements (when applicable) are
obtained utilizing NASCET criteria, using the distal internal
carotid diameter as the denominator.
CONTRAST:  7mL GADAVIST GADOBUTROL 1 MMOL/ML IV SOLN

[Series 1200: cor cemra ft · coronal · 1.2mm · 0.59mm/px · 7 of 105 slices shown]
[im 1/105]
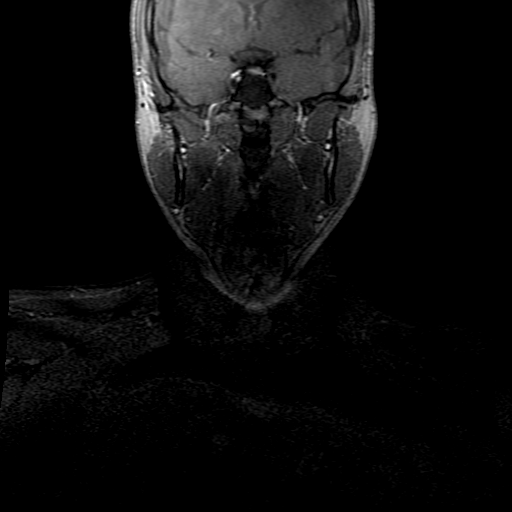
[im 18/105]
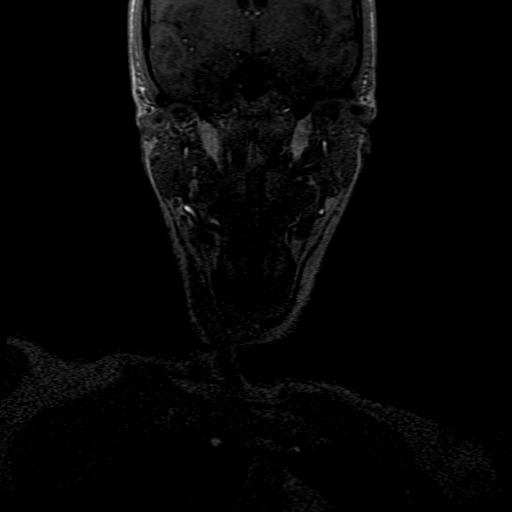
[im 35/105]
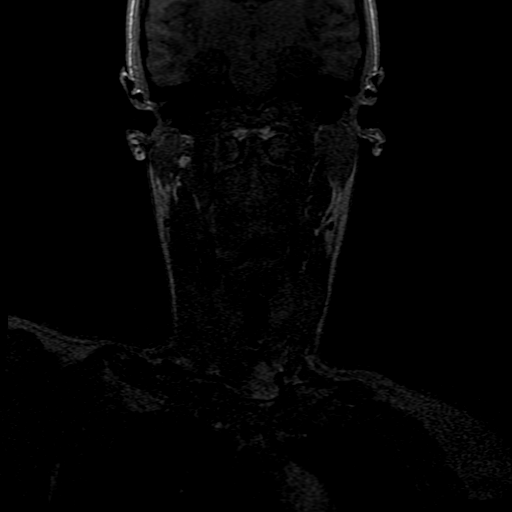
[im 53/105]
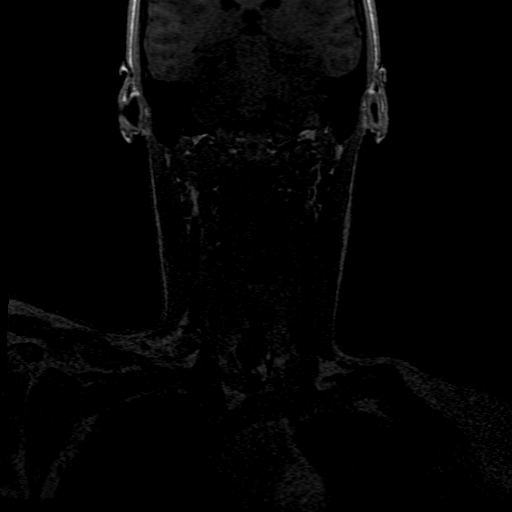
[im 70/105]
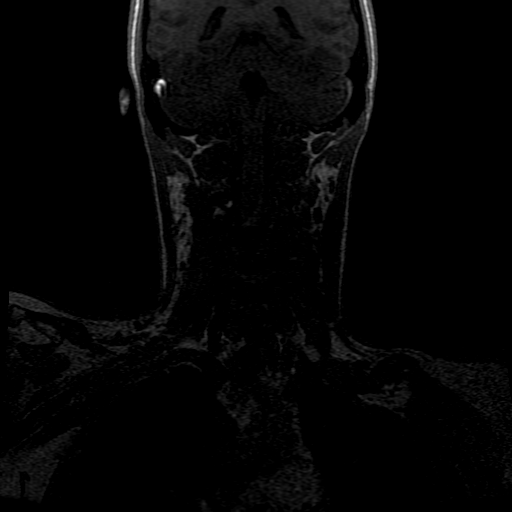
[im 87/105]
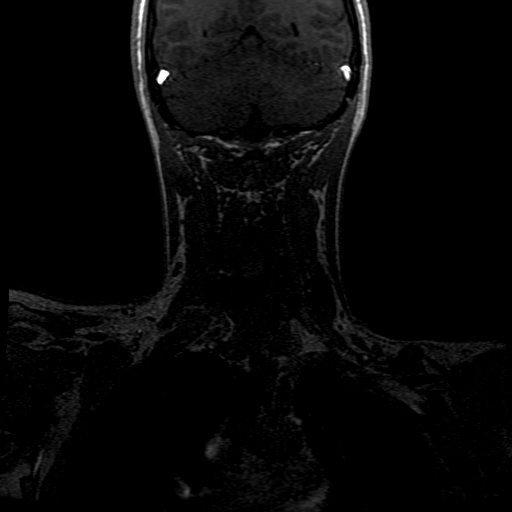
[im 105/105]
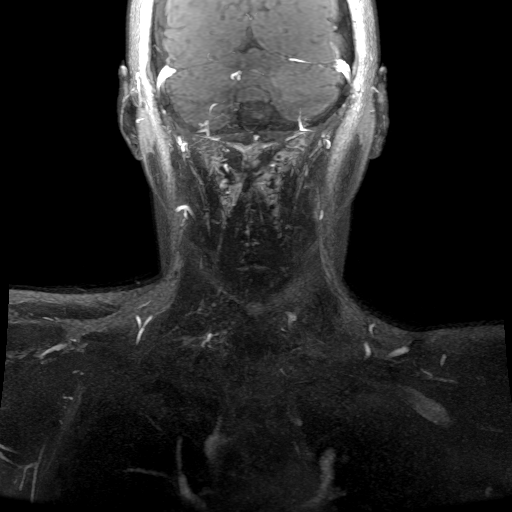

[Series 1201: ph1/cor cemra ft · coronal · 1.2mm · 0.59mm/px · 5 of 104 slices shown]
[im 1/104]
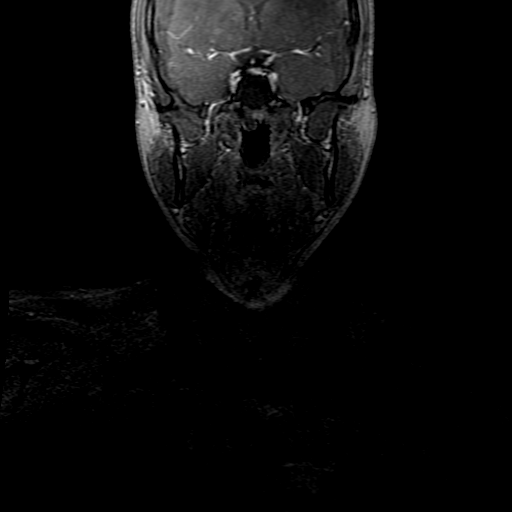
[im 18/104]
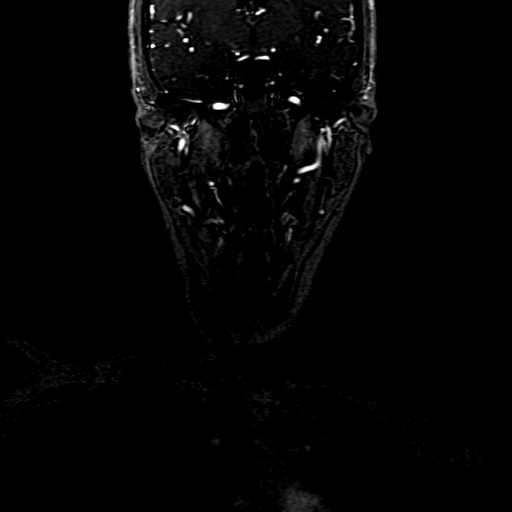
[im 35/104]
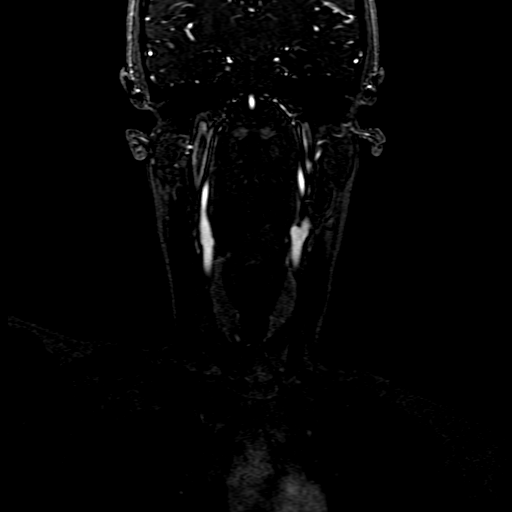
[im 52/104]
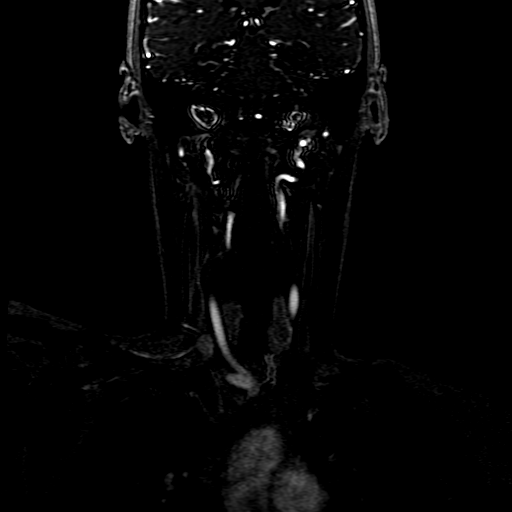
[im 86/104]
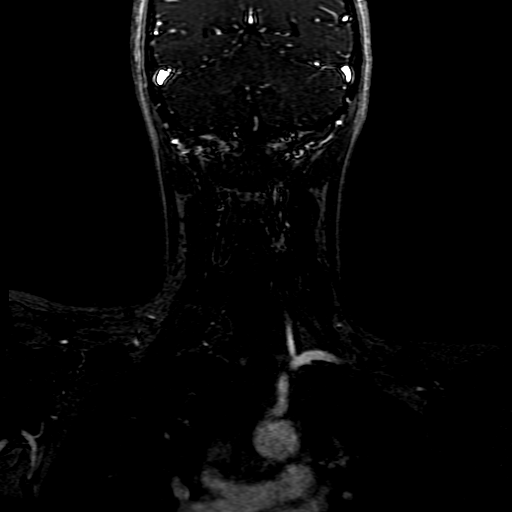

[Series 1202: ph2/cor cemra ft · coronal · 1.2mm · 0.59mm/px · 3 of 105 slices shown]
[im 18/105]
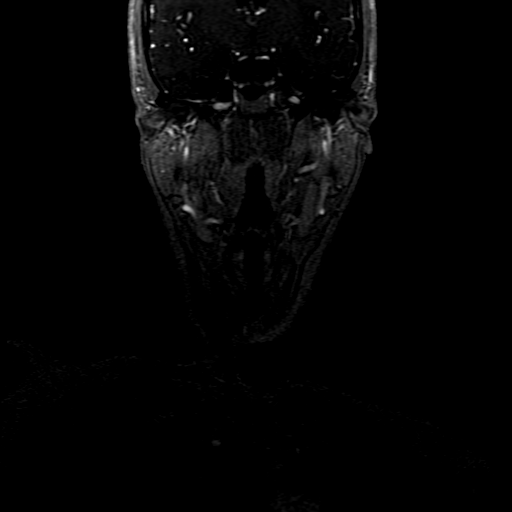
[im 53/105]
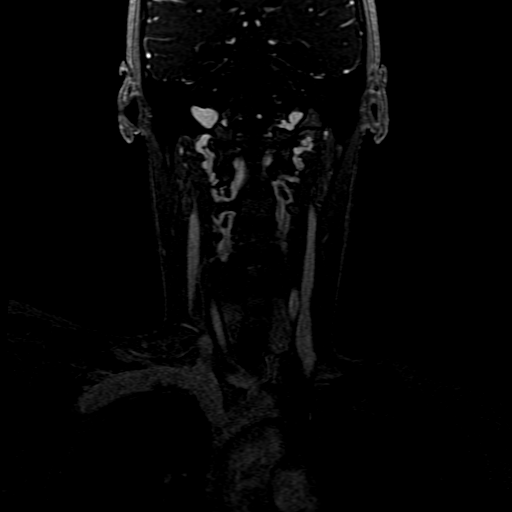
[im 87/105]
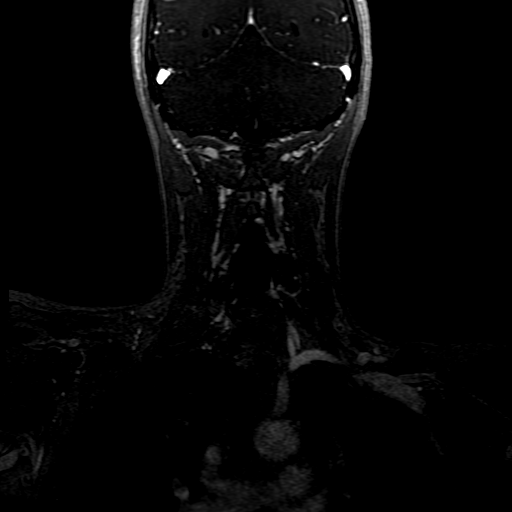

[((date))-((date)) · coronal · 1.2mm · 0.59mm/px · 3 of 104 slices shown]
[im 18/104]
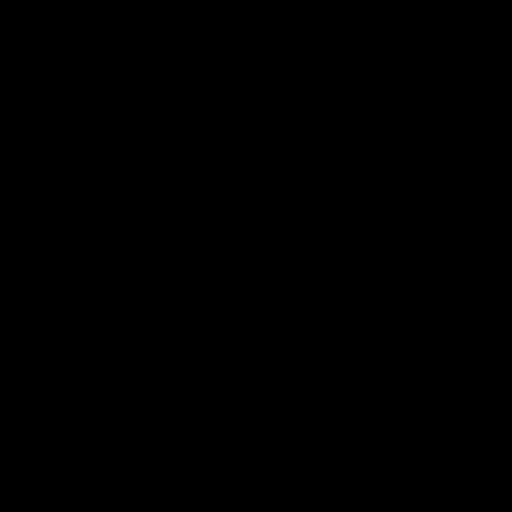
[im 52/104]
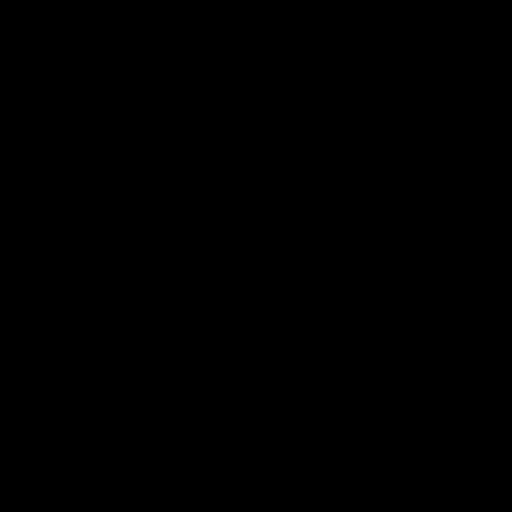
[im 86/104]
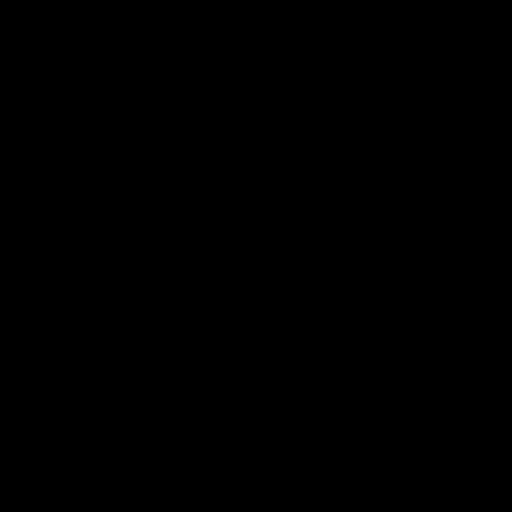

[18 of 48 positions shown; findings below may reference images not displayed]

FINDINGS: MRI HEAD FINDINGS

Brain: No acute infarction, hemorrhage, hydrocephalus, extra-axial
collection or mass lesion. Minimal FLAIR hyperintensity around the
lateral ventricles. No generalized white matter disease, atrophy, or
abnormal enhancement.

Vascular: Normal flow voids and vascular enhancements

Skull and upper cervical spine: Normal marrow signal

Sinuses/Orbits: Negative

Other: Nonenhancing retention cyst in the right nasopharynx

MRA HEAD FINDINGS

Vessels are smooth and widely patent. Negative for aneurysm or
vascular malformation.

MRA NECK FINDINGS

Antegrade flow in the carotid and vertebral arteries by
time-of-flight. No incidental finding in the soft tissues on neck
mask. Normal appearance of the arch and great vessels. The carotid
and vertebral arteries are smoothly contoured and diffusely patent.
IMPRESSION: Brain MRI:

No explanation for symptoms. No infarct or generalized white matter
disease.

MRA of the head and neck:

Normal.

ADDENDUM:
Omitted finding of known colloid cyst measuring 4 mm. No
hydrocephalus.

*** End of Addendum ***
FINDINGS: MRI HEAD FINDINGS

Brain: No acute infarction, hemorrhage, hydrocephalus, extra-axial
collection or mass lesion. Minimal FLAIR hyperintensity around the
lateral ventricles. No generalized white matter disease, atrophy, or
abnormal enhancement.

Vascular: Normal flow voids and vascular enhancements

Skull and upper cervical spine: Normal marrow signal

Sinuses/Orbits: Negative

Other: Nonenhancing retention cyst in the right nasopharynx

MRA HEAD FINDINGS

Vessels are smooth and widely patent. Negative for aneurysm or
vascular malformation.

MRA NECK FINDINGS

Antegrade flow in the carotid and vertebral arteries by
time-of-flight. No incidental finding in the soft tissues on neck
mask. Normal appearance of the arch and great vessels. The carotid
and vertebral arteries are smoothly contoured and diffusely patent.
IMPRESSION: Brain MRI:

No explanation for symptoms. No infarct or generalized white matter
disease.

MRA of the head and neck:

Normal.

## 2021-09-01 MED ORDER — LORAZEPAM 2 MG/ML IJ SOLN
0.5000 mg | Freq: Once | INTRAMUSCULAR | Status: DC | PRN
Start: 2021-09-01 — End: 2021-09-01

## 2021-09-01 MED ORDER — SODIUM CHLORIDE 0.9 % IV SOLN: INTRAVENOUS | Status: DC

## 2021-09-01 MED ORDER — MELATONIN 5 MG PO TABS: 20.0000 mg | ORAL_TABLET | Freq: Every evening | ORAL | Status: DC | PRN

## 2021-09-01 MED ORDER — PROCHLORPERAZINE EDISYLATE 10 MG/2ML IJ SOLN
10.0000 mg | Freq: Once | INTRAMUSCULAR | Status: AC
  Administered 2021-09-01: 10 mg via INTRAVENOUS
  Filled 2021-09-01: qty 2

## 2021-09-01 MED ORDER — ENOXAPARIN SODIUM 40 MG/0.4ML IJ SOSY
40.0000 mg | PREFILLED_SYRINGE | INTRAMUSCULAR | Status: DC
  Administered 2021-09-01: 40 mg via SUBCUTANEOUS
  Filled 2021-09-01: qty 0.4

## 2021-09-01 MED ORDER — ACETAMINOPHEN 160 MG/5ML PO SOLN: 650.0000 mg | ORAL | Status: DC | PRN

## 2021-09-01 MED ORDER — ACETAMINOPHEN 650 MG RE SUPP: 650.0000 mg | RECTAL | Status: DC | PRN

## 2021-09-01 MED ORDER — DIPHENHYDRAMINE HCL 50 MG/ML IJ SOLN
12.5000 mg | Freq: Once | INTRAMUSCULAR | Status: AC
Start: 2021-09-01 — End: 2021-09-01
  Administered 2021-09-01: 12.5 mg via INTRAVENOUS
  Filled 2021-09-01: qty 1

## 2021-09-01 MED ORDER — PRENATAL MULTIVITAMIN CH
1.0000 | ORAL_TABLET | Freq: Every day | ORAL | Status: DC
  Administered 2021-09-01: 1 via ORAL
  Filled 2021-09-01: qty 1

## 2021-09-01 MED ORDER — ACETAMINOPHEN 325 MG PO TABS: 650.0000 mg | ORAL_TABLET | ORAL | Status: DC | PRN

## 2021-09-01 MED ORDER — SENNOSIDES-DOCUSATE SODIUM 8.6-50 MG PO TABS
1.0000 | ORAL_TABLET | Freq: Every evening | ORAL | Status: DC | PRN
Start: 2021-09-01 — End: 2021-09-01

## 2021-09-01 MED ORDER — STROKE: EARLY STAGES OF RECOVERY BOOK
Freq: Once | Status: DC
  Filled 2021-09-01: qty 1

## 2021-09-01 MED ORDER — POTASSIUM CHLORIDE CRYS ER 20 MEQ PO TBCR
40.0000 meq | EXTENDED_RELEASE_TABLET | ORAL | Status: AC
Start: 2021-09-01 — End: 2021-09-01
  Administered 2021-09-01: 40 meq via ORAL
  Filled 2021-09-01: qty 2

## 2021-09-01 MED ORDER — GADOBUTROL 1 MMOL/ML IV SOLN
7.0000 mL | Freq: Once | INTRAVENOUS | Status: AC | PRN
Start: 2021-09-01 — End: 2021-09-01
  Administered 2021-09-01: 7 mL via INTRAVENOUS

## 2021-09-01 NOTE — ED Notes (Signed)
Pt to MRI

## 2021-09-01 NOTE — ED Notes (Signed)
Provider at bedside at this time

## 2021-09-01 NOTE — H&P (Addendum)
History and Physical    Patient: Cynthia Campbell DOB: [birth year] DOA: 08/31/2021 DOS: the patient was seen and examined on 09/01/2021 PCP: Faustino Congress, NP  Patient coming from: Home  Chief Complaint:  Chief Complaint  Patient presents with   Weakness   HPI: Cynthia Campbell is a 36 y.o. female with medical history significant of ulcerative colitis, anxiety, GERD, and s/p splenic cyst removal who presents with left-sided tingling.  She reports this several month history of intermittent chest tightness with discomfort down her left arm.  Found to have splenic cyst thought to be a possible cause of symptoms which was removed 04/2021 by surgery.  However, symptoms have persisted since that time.  Recently patient reported having numbness radiating from her left buttock down her leg.  Last night she developed tingling sensation left face with twitching of her left eye/face, and felt like her face was drooping.  In addition to the symptoms she reports having this feeling that her whole body is vibrating.  Patient notes that here lately she has been fatigued.  Denies having any fever, headache, changes in her vision, dysuria, recent falls.  Her and her significant other lives outdoors and had went camping recently, but denies any known tick bites.  She did come in contact with poison ivy, but reports that it has resolved.  On admission to the emergency department patient was noted to have stable vital signs with labs relatively within normal limits.  Urine pregnancy screen was negative.  CT scan of the head without contrast noted a 4 mm colloid cyst, no hydrocephalus, and no other acute intercranial abnormality.  Initial urinalysis noted no abnormalities, then recheck urinalysis noted moderate leukocytes with no bacteria seen, and 11-20 WBCs.     review of Systems: As mentioned in the history of present illness. All other systems reviewed and are negative. Past Medical History:   Diagnosis Date   Anxiety    GERD (gastroesophageal reflux disease)    Medical history non-contributory    Ulcerative colitis ([place])    Past Surgical History:  Procedure Laterality Date   COLONOSCOPY     LAPAROSCOPIC SPLENECTOMY N/A 05/18/2021   Procedure: LAPAROSCOPIC SPLENECTOMY;  Surgeon: Kieth Brightly Arta Bruce, MD;  Location: [facility];  Service: General;  Laterality: N/A;   WISDOM TOOTH EXTRACTION     Social History:  reports that she has never smoked. She has never used smokeless tobacco. She reports current alcohol use. She reports that she does not use drugs.  No Known Allergies  History reviewed. No pertinent family history.  Prior to Admission medications   Medication Sig Start Date End Date Taking? Authorizing Provider  amphetamine-dextroamphetamine (ADDERALL) 20 MG tablet Take 20 mg by mouth daily as needed (attention/focusing).   Yes [provider]  diphenhydrAMINE (BENADRYL) 25 MG tablet Take 50 mg by mouth every 6 (six) hours as needed for allergies or sleep.   Yes [provider]  ibuprofen (ADVIL) 200 MG tablet Take 400 mg by mouth every 6 (six) hours as needed for headache or moderate pain.   Yes [provider]  Melatonin 10 MG TABS Take 20 mg by mouth at bedtime as needed (sleep).   Yes [provider]  mesalamine (CANASA) 1000 MG suppository Place 1,000 mg rectally once a week. No set day 12/05/20  Yes [provider]  Prenatal Vit-Fe Fumarate-FA (PRENATAL PO) Take 1 tablet by mouth daily.   Yes [provider]    Physical Exam: Vitals:  09/01/21 0945 09/01/21 1000 09/01/21 1100 09/01/21 1130  BP: 116/78 116/75 (!) 118/97 120/87  Pulse: 71 69 88 66  Resp: '19 13 20 17  ' Temp:      TempSrc:      SpO2: 99% 100% 100% 100%   Constitutional: Young female currently in no acute distress, but appears fatigued Eyes: PERRL, lids and conjunctivae normal ENMT: Mucous membranes are moist. Posterior pharynx clear of any  exudate or lesions.  Neck: normal, supple, without lymphadenopathy appreciated  Respiratory: clear to auscultation bilaterally, no wheezing, no crackles. Normal respiratory effort. No accessory muscle use.  Cardiovascular: Regular rate and rhythm, no murmurs / rubs / gallops. No extremity edema.  Abdomen: no tenderness, no masses palpated. No hepatosplenomegaly. Bowel sounds positive.  Musculoskeletal: no clubbing / cyanosis.  Good ROM, no contractures. Normal muscle tone.  Skin: Resolving rash of wrist Neurologic: CN 2-12 grossly intact.  Patient appears able to move all extremities without focal  weakness appreciated. Psychiatric: Normal judgment and insight. Alert and oriented x 3. Normal mood.   Data Reviewed:  EKG revealed normal sinus rhythm at 76 bpm.  Reviewed labs, imaging, and pertinent records as noted above in HPI.  Assessment and Plan: Left facial weakness/twitching Patient presented for left facial weakness/twitching symptoms starting last night.  CT note 4 mm colloid cyst, but not thought to be related to patient's symptoms.  Neurology recommended MRI/MRI imaging of brain which did not note any acute abnormalities to give concern for multiple sclerosis or stroke.  LDL 63 and hemoglobin A1c 4.9.  No clear cause found at this time for patient's symptoms question possibility of a complex migraine. -Admit to a medical telemetry bed -Neurochecks -Follow-up ANA testing -Check ESR, TSH, and magnesium level   Chest tightness Chronic in nature. Not thought to be cardiac in nature.  Initially thought secondary to splenic cyst which was removed in February, but symptoms still persist.  EKG within normal limits without significant ischemic changes. -Check echocardiogram   Ulcerative colitis, without complication Patient takes mesalamine 1000 mg rectally weekly.  Last dose on 6/5.  Symptoms reportedly had been well controlled.    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  Thought to be benign.      Advance Care Planning:   Code Status: Full Code   Consults Neurology   Family Communication: Significant other updated at bedside  Severity of Illness: The appropriate patient status for this patient is OBSERVATION. Observation status is judged to be reasonable and necessary in order to provide the required intensity of service to ensure the patient's safety. The patient's presenting symptoms, physical exam findings, and initial radiographic and laboratory data in the context of their medical condition is felt to place them at decreased risk for further clinical deterioration. Furthermore, it is anticipated that the patient will be medically stable for discharge from the hospital within 2 midnights of admission.   Author: Norval Morton, MD 09/01/2021 3:26 PM  For on call review [email or web address].

## 2021-09-01 NOTE — ED Notes (Signed)
Admitting MD paged regarding need for PRN med for MRI.

## 2021-09-01 NOTE — Discharge Summary (Signed)
Physician Discharge Summary   Patient: Cynthia Campbell MRN: [id number] DOB: 16-Nov-1985  Admit date:     08/31/2021  Discharge date: 09/01/21  Discharge Physician: Norval Morton   PCP: Faustino Congress, NP   Recommendations at discharge:   Recommend following up on ANA.  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 *  Hospital Course:  Cynthia Campbell is a 36 y.o. female with medical history significant of ulcerative colitis, anxiety, GERD, and s/p splenic cyst removal who presents with left-sided tingling.  She reports this several month history of intermittent chest tightness with discomfort down her left arm.  Found to have splenic cyst thought to be a possible cause of symptoms which was removed 04/2021 by surgery.  However, symptoms have persisted since that time.  Recently patient reported having numbness radiating from her left buttock down her leg.  Last night she developed tingling sensation left face with twitching of her left eye/face, and felt like her face was drooping.  In addition to the symptoms she reports having this feeling that her whole body is vibrating.  Patient notes that here lately she has been fatigued.  Denies having any fever, headache, changes in her vision, dysuria, recent falls.  Her and her significant other lives outdoors and had went camping recently, but denies any known tick bites.  She did come in contact with poison ivy, but reports that it has resolved.   On admission to the emergency department patient was noted to have stable vital signs with labs relatively within normal limits.  Urine  pregnancy screen was negative.  CT scan of the head without contrast noted a 4 mm colloid cyst, no hydrocephalus, and no other acute intercranial abnormality.  Initial urinalysis noted no abnormalities, then recheck urinalysis noted moderate leukocytes with no bacteria seen, and 11-20 WBCs.      Assessment and Plan: Left facial weakness/twitching Patient presented for left facial weakness/twitching symptoms starting last night.  CT note 4 mm colloid cyst, but not thought to be related to patient's symptoms.  Neurology recommended MRI/MRI imaging of brain which did not note any acute abnormalities to give concern for multiple sclerosis or stroke.  LDL 63 and hemoglobin A1c 4.9.  UDS was positive for amphetamines, but patient is on ADHD medication.  No clear cause found at this time for patient's symptoms.  Neurology question the possibility of complex migraines or anxiety as a cause. -ANA, magnesium, TSH, and sedimentation rate are all pending -Patient was recommended to follow-up with [facility] neurology in the outpatient setting   Chest tightness Chronic in nature. Not thought to be cardiac in nature.  Initially thought secondary to splenic cyst which was removed in February, but symptoms still persist.  EKG within normal limits without significant ischemic changes. -Recommend patient follow-up in for echocardiogram   Ulcerative colitis, without complication Patient takes mesalamine 1000 mg rectally weekly.  Last dose on 6/5.  Symptoms reportedly had been well controlled.    -Continue mesalamine at discharge.   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  Thought to be benign.         Consultants: Neurology Procedures performed: None Disposition: Home Diet recommendation:  Discharge Diet Orders (From admission, onward)     Start     Ordered   09/01/21 0000  Diet - low sodium  heart healthy        09/01/21 1650           Regular diet DISCHARGE MEDICATION: Allergies as of 09/01/2021   No Known Allergies      Medication List     TAKE these medications    amphetamine-dextroamphetamine 20 MG tablet Commonly known as: ADDERALL Take 20 mg by mouth daily as needed (attention/focusing).   diphenhydrAMINE 25 MG tablet Commonly known as: BENADRYL Take 50 mg by mouth every 6 (six) hours as needed for allergies or sleep.   ibuprofen 200 MG tablet Commonly known as: ADVIL Take 400 mg by mouth every 6 (six) hours as needed for headache or moderate pain.   Melatonin 10 MG Tabs Take 20 mg by mouth at bedtime as needed (sleep).   mesalamine 1000 MG suppository Commonly known as: CANASA Place 1,000 mg rectally once a week. No set day   PRENATAL PO Take 1 tablet by mouth daily.        Follow-up Information     [facility]. Call.   Specialty: Neurology Why: please call and make an appointment for follow-up Contact information: [place] [phone]               Discharge Exam: Unchanged from admission.  Patient ready to be discharged home  Condition at discharge: stable  The results of significant diagnostics from this hospitalization (including imaging, microbiology, ancillary and laboratory) are listed below for reference.   Imaging Studies: MR BRAIN W WO CONTRAST  Result Date: 09/01/2021 CLINICAL DATA:  Left-sided paresthesia EXAM: MRI HEAD WITHOUT AND WITH CONTRAST MRA HEAD WITHOUT CONTRAST MRA NECK WITHOUT AND WITH CONTRAST TECHNIQUE: Multiplanar, multiecho pulse sequences of the brain and surrounding structures were obtained without and with intravenous contrast. Angiographic images of the Circle of Willis were obtained using MRA technique without intravenous contrast. Angiographic images of the neck were obtained using MRA technique without and with intravenous  contrast. Carotid stenosis measurements (when applicable) are obtained utilizing NASCET criteria, using the distal internal carotid diameter as the denominator. CONTRAST:  79m GADAVIST GADOBUTROL 1 MMOL/ML IV SOLN COMPARISON:  Head CT from yesterday FINDINGS: MRI HEAD FINDINGS Brain: No acute infarction, hemorrhage, hydrocephalus, extra-axial collection or mass lesion. Minimal FLAIR hyperintensity around the lateral ventricles. No generalized white matter disease, atrophy, or abnormal enhancement. Vascular: Normal flow voids and vascular enhancements Skull and upper cervical spine: Normal marrow signal Sinuses/Orbits: Negative Other: Nonenhancing retention cyst in the right nasopharynx MRA HEAD FINDINGS Vessels are smooth and widely patent. Negative for aneurysm or vascular malformation. MRA NECK FINDINGS Antegrade flow in the carotid and vertebral arteries by time-of-flight. No incidental finding in the soft tissues on neck mask. Normal appearance of the arch and great vessels. The carotid and vertebral arteries are smoothly contoured and diffusely patent. IMPRESSION: Brain MRI: No explanation for symptoms. No infarct or generalized white matter disease. MRA of the head and neck: Normal. Electronically Signed   By: JJorje GuildM.D.   On: 09/01/2021 09:52   MR ANGIO HEAD WO CONTRAST  Result Date: 09/01/2021 CLINICAL DATA:  Left-sided paresthesia EXAM: MRI HEAD WITHOUT  AND WITH CONTRAST MRA HEAD WITHOUT CONTRAST MRA NECK WITHOUT AND WITH CONTRAST TECHNIQUE: Multiplanar, multiecho pulse sequences of the brain and surrounding structures were obtained without and with intravenous contrast. Angiographic images of the Circle of Willis were obtained using MRA technique without intravenous contrast. Angiographic images of the neck were obtained using MRA technique without and with intravenous contrast. Carotid stenosis measurements (when applicable) are obtained utilizing NASCET criteria, using the distal internal  carotid diameter as the denominator. CONTRAST:  47m GADAVIST GADOBUTROL 1 MMOL/ML IV SOLN COMPARISON:  Head CT from yesterday FINDINGS: MRI HEAD FINDINGS Brain: No acute infarction, hemorrhage, hydrocephalus, extra-axial collection or mass lesion. Minimal FLAIR hyperintensity around the lateral ventricles. No generalized white matter disease, atrophy, or abnormal enhancement. Vascular: Normal flow voids and vascular enhancements Skull and upper cervical spine: Normal marrow signal Sinuses/Orbits: Negative Other: Nonenhancing retention cyst in the right nasopharynx MRA HEAD FINDINGS Vessels are smooth and widely patent. Negative for aneurysm or vascular malformation. MRA NECK FINDINGS Antegrade flow in the carotid and vertebral arteries by time-of-flight. No incidental finding in the soft tissues on neck mask. Normal appearance of the arch and great vessels. The carotid and vertebral arteries are smoothly contoured and diffusely patent. IMPRESSION: Brain MRI: No explanation for symptoms. No infarct or generalized white matter disease. MRA of the head and neck: Normal. Electronically Signed   By: JJorje GuildM.D.   On: 09/01/2021 09:52   MR ANGIO NECK W WO CONTRAST  Result Date: 09/01/2021 CLINICAL DATA:  Left-sided paresthesia EXAM: MRI HEAD WITHOUT AND WITH CONTRAST MRA HEAD WITHOUT CONTRAST MRA NECK WITHOUT AND WITH CONTRAST TECHNIQUE: Multiplanar, multiecho pulse sequences of the brain and surrounding structures were obtained without and with intravenous contrast. Angiographic images of the Circle of Willis were obtained using MRA technique without intravenous contrast. Angiographic images of the neck were obtained using MRA technique without and with intravenous contrast. Carotid stenosis measurements (when applicable) are obtained utilizing NASCET criteria, using the distal internal carotid diameter as the denominator. CONTRAST:  752mGADAVIST GADOBUTROL 1 MMOL/ML IV SOLN COMPARISON:  Head CT from  yesterday FINDINGS: MRI HEAD FINDINGS Brain: No acute infarction, hemorrhage, hydrocephalus, extra-axial collection or mass lesion. Minimal FLAIR hyperintensity around the lateral ventricles. No generalized white matter disease, atrophy, or abnormal enhancement. Vascular: Normal flow voids and vascular enhancements Skull and upper cervical spine: Normal marrow signal Sinuses/Orbits: Negative Other: Nonenhancing retention cyst in the right nasopharynx MRA HEAD FINDINGS Vessels are smooth and widely patent. Negative for aneurysm or vascular malformation. MRA NECK FINDINGS Antegrade flow in the carotid and vertebral arteries by time-of-flight. No incidental finding in the soft tissues on neck mask. Normal appearance of the arch and great vessels. The carotid and vertebral arteries are smoothly contoured and diffusely patent. IMPRESSION: Brain MRI: No explanation for symptoms. No infarct or generalized white matter disease. MRA of the head and neck: Normal. Electronically Signed   By: JoJorje Guild.D.   On: 09/01/2021 09:52   CT HEAD WO CONTRAST  Result Date: 08/31/2021 CLINICAL DATA:  Neuro deficit, acute, stroke suspected. Left-sided weakness, numbness. EXAM: CT HEAD WITHOUT CONTRAST TECHNIQUE: Contiguous axial images were obtained from the base of the skull through the vertex without intravenous contrast. RADIATION DOSE REDUCTION: This exam was performed according to the departmental dose-optimization program which includes automated exposure control, adjustment of the mA and/or kV according to patient size and/or use of iterative reconstruction technique. COMPARISON:  None Available. FINDINGS: Brain: 4 mm hyperdensity seen in the anterior  3rd ventricle compatible with colloid cyst. No hydrocephalus. No acute infarct or hemorrhage. Vascular: No hyperdense vessel or unexpected calcification. Skull: No acute calvarial abnormality. Sinuses/Orbits: No acute findings Other: None IMPRESSION: 4 mm colloid cyst.  No  hydrocephalus. No acute intracranial abnormality. Electronically Signed   By: Rolm Baptise M.D.   On: 08/31/2021 23:06    Microbiology: Results for orders placed or performed during the hospital encounter of 05/18/21  SARS CORONAVIRUS 2 (TAT 6-24 HRS) Nasopharyngeal Nasopharyngeal Swab     Status: None   Collection Time: 05/15/21  7:35 AM   Specimen: Nasopharyngeal Swab  Result Value Ref Range Status   SARS Coronavirus 2 NEGATIVE NEGATIVE Final    Comment: (NOTE) SARS-CoV-2 target nucleic acids are NOT DETECTED.  The SARS-CoV-2 RNA is generally detectable in upper and lower respiratory specimens during the acute phase of infection. Negative results do not preclude SARS-CoV-2 infection, do not rule out co-infections with other pathogens, and should not be used as the sole basis for treatment or other patient management decisions. Negative results must be combined with clinical observations, patient history, and epidemiological information. The expected result is Negative.  Fact Sheet for Patients: SugarRoll.be  Fact Sheet for Healthcare Providers: https://www.woods-mathews.com/  This test is not yet approved or cleared by the [place] FDA and  has been authorized for detection and/or diagnosis of SARS-CoV-2 by FDA under an Emergency Use Authorization (EUA). This EUA will remain  in effect (meaning this test can be used) for the duration of the COVID-19 declaration under Se ction 564(b)(1) of the Act, 21 U.S.C. section 360bbb-3(b)(1), unless the authorization is terminated or revoked sooner.  Performed at [facility], [place] [address], [place], [place] [address]     Labs: CBC: Recent Labs  Lab 08/31/21 2143  WBC 8.8  HGB 13.6  HCT 39.0  MCV 90.5  PLT 968   Basic Metabolic Panel: Recent Labs  Lab 08/31/21 2143  NA 138  K 3.7  CL 106  CO2 24  GLUCOSE 97  BUN 17  CREATININE 0.79  CALCIUM 9.3   Liver Function  Tests: Recent Labs  Lab 08/31/21 2143  AST 17  ALT 18  ALKPHOS 59  BILITOT 0.6  PROT 7.1  ALBUMIN 4.2   CBG: Recent Labs  Lab 08/31/21 2140  GLUCAP 101*    Discharge time spent: less than 30 minutes.  Signed: Norval Morton, MD [facility] 09/01/2021

## 2021-09-01 NOTE — Consult Note (Addendum)
Neurology Consultation Note  Consult Requested by: Dr. Dayna Barker  Reason for Consult: left sided tingling  Consult Date: 09/01/21   The history was obtained from the pt and husband.  During history and examination, all items were able to obtain unless otherwise noted.  History of Present Illness:  Cynthia Campbell is a 36 y.o. Caucasian female with PMH of ulcerative colitis on mesalamine, chronic chest tightness status post splenic cyst removal presented to ER for left-sided tingling. 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  Per patient, since 11/2020 patient started have left chest tightness along with left arm tightness feeling, along with bilateral hand and tip of her nose tingling.  Had a follow-up with PCP and discovered splenic cyst which was removed in 04/2021.  After surgery, bilateral hand and tip of nose tingling have resolved, however, patient still complaining of left chest tightness along with left arm tenderness, which are comes and goes.  She was working whole day yesterday and felt to have left buttock, left LE tingling, and whole inner body vibration feeling. At night, she also felt her left face muscle tightness/spastic with left facial droop. Husband also confirmed left facial droop but has improved/resolved in ER.   Pt has history of ulcerative colitis with rectal bleeding in the past, currently on mesalamine, seems doing well.  No other significant past med history.  LSN: yesterday in the morning tPA Given: No: neuro intact, outside window  Past Medical History:  Diagnosis Date   Anxiety    GERD (gastroesophageal reflux disease)    Medical history non-contributory     Past Surgical History:  Procedure Laterality Date   COLONOSCOPY     LAPAROSCOPIC SPLENECTOMY N/A 05/18/2021   Procedure: LAPAROSCOPIC SPLENECTOMY;  Surgeon: Mickeal Skinner, MD;  Location: [facility];  Service: General;  Laterality: N/A;   WISDOM TOOTH EXTRACTION      History reviewed. No pertinent family  history.  Social History:  reports that she has never smoked. She has never used smokeless tobacco. She reports current alcohol use. She reports that she does not use drugs.  Allergies: No Known Allergies  No current facility-administered medications on file prior to encounter.   Current Outpatient Medications on File Prior to Encounter  Medication Sig Dispense Refill   amphetamine-dextroamphetamine (ADDERALL) 20 MG tablet Take 20 mg by mouth daily as needed (attention/focusing).     benzonatate (TESSALON) 200 MG capsule Take 1 capsule (200 mg total) by mouth 2 (two) times daily as needed for cough. 20 capsule 0   cephALEXin (KEFLEX) 500 MG capsule Take 1 capsule (500 mg total) by mouth 2 (two) times daily. 14 capsule 0   MELATONIN PO Take 40 mg by mouth at bedtime.     mesalamine (CANASA) 1000 MG suppository Place 1,000 mg rectally every Wednesday.     naproxen (NAPROSYN) 500 MG tablet Take 500 mg by mouth at bedtime.     oxyCODONE (OXY IR/ROXICODONE) 5 MG immediate release tablet Take 1 tablet (5 mg total) by mouth every 6 (six) hours as needed for moderate pain. 15 tablet 0   Prenatal Vit-Fe Fumarate-FA (PRENATAL PO) Take 1 tablet by mouth daily.      Review of Systems: A full ROS was attempted today and was able to be performed.  Systems assessed include - Constitutional, Eyes, HENT, Respiratory, Cardiovascular, Gastrointestinal, Genitourinary, Integument/breast, Hematologic/lymphatic, Musculoskeletal, Neurological, Behavioral/Psych, Endocrine, Allergic/Immunologic - with pertinent responses as per HPI.  Physical Examination: Temp:  [97.9 F (36.6 C)] 97.9 F (36.6  C) (06/09 2118) Pulse Rate:  [73-78] 73 (06/10 0122) Resp:  [16] 16 (06/10 0122) BP: (123-133)/(78-88) 123/88 (06/10 0122) SpO2:  [100 %] 100 % (06/10 0122)  General - well nourished, well developed, in no apparent distress.    Ophthalmologic - fundi not visualized due to noncooperation.    Cardiovascular - regular  rhythm and rate  Mental Status -  Level of arousal and orientation to time, place, and person were intact. Language including expression, naming, repetition, comprehension, reading, and writing was assessed and found intact. Attention span and concentration were normal. Recent and remote memory were intact. Fund of Knowledge was assessed and was intact.  Cranial Nerves II - XII - II - Vision intact OU. III, IV, VI - Extraocular movements intact. V - Facial sensation intact bilaterally. VII - Facial movement intact bilaterally. VIII - Hearing & vestibular intact bilaterally. X - Palate elevates symmetrically. XI - Chin turning & shoulder shrug intact bilaterally. XII - Tongue protrusion intact.  Motor Strength - The patient's strength was normal in all extremities and pronator drift was absent.   Motor Tone & Bulk - Muscle tone was assessed at the neck and appendages and was normal.  Bulk was normal and fasciculations were absent.   Reflexes - The patient's reflexes were normal in all extremities and she had no pathological reflexes.  Sensory - Light touch, temperature/pinprick were assessed and were normal.    Coordination - The patient had normal movements in the hands and feet with no ataxia or dysmetria.  Tremor was absent.  Gait and Station - deferred  Data Reviewed: CT HEAD WO CONTRAST  Result Date: 08/31/2021 CLINICAL DATA:  Neuro deficit, acute, stroke suspected. Left-sided weakness, numbness. EXAM: CT HEAD WITHOUT CONTRAST TECHNIQUE: Contiguous axial images were obtained from the base of the skull through the vertex without intravenous contrast. RADIATION DOSE REDUCTION: This exam was performed according to the departmental dose-optimization program which includes automated exposure control, adjustment of the mA and/or kV according to patient size and/or use of iterative reconstruction technique. COMPARISON:  None Available. FINDINGS: Brain: 4 mm hyperdensity seen in the  anterior 3rd ventricle compatible with colloid cyst. No hydrocephalus. No acute infarct or hemorrhage. Vascular: No hyperdense vessel or unexpected calcification. Skull: No acute calvarial abnormality. Sinuses/Orbits: No acute findings Other: None IMPRESSION: 4 mm colloid cyst.  No hydrocephalus. No acute intracranial abnormality. Electronically Signed   By: Rolm Baptise M.D.   On: 08/31/2021 23:06    Assessment: 36 y.o. female with PMH of ulcerative colitis on mesalamine, chronic left chest tightness and left arm tightness status post splenic cyst removal presented to ER for left LE tingling and left facial tightness with possible left facial droop yesterday. Currently symptoms resolved, neuro intact. CT no acute abnormalities except small 3rd ventricle colloid cyst. Etiology for pt symptoms concerning for anxiety, however, would like to do MRI with and without to rule out stroke or MS. 2D echo to evaluate for persistent chest tightness. Not consistent with seizure, no EEG needed at this time.    Plan: Continue further work up  MRI brain with and without to rule out stroke or MS MRA head and neck 2D echo for chest tightness UDS, fasting lipid panel and HgbA1C, ANA, urine pregnancy test PT/OT/speech consult Discussed with Dr. Dayna Barker ED physician We will follow   Thank you for this consultation and allowing [place] to participate in the care of this patient.  Rosalin Hawking, MD PhD Stroke Neurology 09/01/2021 5:25 AM

## 2021-09-01 NOTE — Evaluation (Signed)
Occupational Therapy Evaluation Patient Details Name: Cynthia Campbell MRN: [id number] DOB: Apr 27, 1985 Today's Date: 09/01/2021   History of Present Illness 36 y.o. Caucasian female presented to ER 09/01/21 for left-sided tingling. CT head no acute changes; MRI pending. PMH of ulcerative colitis on mesalamine, chronic chest tightness status post splenic cyst removal   Clinical Impression   Pt admitted for concerns listed above. PTA pt reported independence with all ADL's and IADL's, including working and driving. At this time, pt presents with continued functional independence, and able to complete ADL's independently. She continues to have numbness on her face, drooping on the L side, and feels generally fatigued with movement. Vision and cognition WFL. Pt has no further skilled OT needs and acute OT will sign off.       Recommendations for follow up therapy are one component of a multi-disciplinary discharge planning process, led by the attending physician.  Recommendations may be updated based on patient status, additional functional criteria and insurance authorization.   Follow Up Recommendations  No OT follow up    Assistance Recommended at Discharge PRN  Patient can return home with the following      Functional Status Assessment  Patient has had a recent decline in their functional status and demonstrates the ability to make significant improvements in function in a reasonable and predictable amount of time.  Equipment Recommendations  None recommended by OT    Recommendations for Other Services       Precautions / Restrictions Precautions Precautions: None Restrictions Weight Bearing Restrictions: No      Mobility Bed Mobility Overal bed mobility: Independent             General bed mobility comments: on ED stretcher    Transfers Overall transfer level: Independent Equipment used: None               General transfer comment: from ED stretcher       Balance Overall balance assessment: Independent                                         ADL either performed or assessed with clinical judgement   ADL Overall ADL's : At baseline;Independent                                       General ADL Comments: Pt is able to complete ADL's with no assist, functional mobility slower than normal, but safe and well balanced     Vision Baseline Vision/History: 0 No visual deficits Ability to See in Adequate Light: 0 Adequate Patient Visual Report: Other (comment) Vision Assessment?: No apparent visual deficits Additional Comments: Pt reports intermittent dizziness     Perception     Praxis      Pertinent Vitals/Pain Pain Assessment Pain Assessment: 0-10 Pain Score: 4  Pain Location: Rib pain, L chest to bicep Pain Descriptors / Indicators: Aching, Discomfort, Grimacing Pain Intervention(s): Monitored during session, Repositioned     Hand Dominance Right   Extremity/Trunk Assessment Upper Extremity Assessment Upper Extremity Assessment: Overall WFL for tasks assessed   Lower Extremity Assessment Lower Extremity Assessment: Overall WFL for tasks assessed   Cervical / Trunk Assessment Cervical / Trunk Assessment: Normal   Communication Communication Communication: No difficulties   Cognition Arousal/Alertness: Awake/alert Behavior During Therapy: WFL for  tasks assessed/performed Overall Cognitive Status: Within Functional Limits for tasks assessed                                       General Comments  VSS on RA - pt reporting "buzzing"/Vibrations in her head as she mobilized to the bathroom.    Exercises     Shoulder Instructions      Home Living Family/patient expects to be discharged to:: Private residence Living Arrangements: Spouse/significant other;Children Available Help at Discharge: Family Type of Home: House Home Access: Stairs to enter [employer] of Steps: 5 stairs Entrance Stairs-Rails: Can reach both Home Layout: Two level;Able to live on main level with bedroom/bathroom Alternate Level Stairs-Number of Steps: full flight Alternate Level Stairs-Rails: Can reach both Bathroom Shower/Tub: Occupational psychologist: Handicapped height     Home Equipment: Shower seat - built in;Grab bars - toilet;Hand held shower head          Prior Functioning/Environment Prior Level of Function : Independent/Modified Independent;Working/employed               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        OT Problem List: Impaired vision/perception;Impaired sensation      OT Treatment/Interventions:      OT Goals(Current goals can be found in the care plan section) Acute Rehab OT Goals Patient Stated Goal: To get back to normal OT Goal Formulation: With patient Time For Goal Achievement: 09/15/21 Potential to Achieve Goals: Good  OT Frequency:      Co-evaluation              AM-PAC OT "6 Clicks" Daily Activity     Outcome Measure Help from another person eating meals?: None Help from another person taking care of personal grooming?: None Help from another person toileting, which includes using toliet, bedpan, or urinal?: None Help from another person bathing (including washing, rinsing, drying)?: None Help from another person to put on and taking off regular upper body clothing?: None Help from another person to put on and taking off regular lower body clothing?: None 6 Click Score: 24   End of Session Nurse Communication: Mobility status  Activity Tolerance: Patient tolerated treatment well Patient left: in bed;with call bell/phone within reach;with family/visitor present  OT Visit Diagnosis: Muscle weakness (generalized) (M62.81);Other (comment) (Dizziness)                Time: 1610-9604 OT Time Calculation (min): 31 min Charges:  OT General Charges $OT Visit: 1 Visit OT  Evaluation $OT Eval Moderate Complexity: 1 Mod OT Treatments $Self Care/Home Management : 8-22 mins  Cynthia Campbell H., Cynthia Campbell Acute Rehabilitation  Caridad Silveira Elane Olesya Wike 09/01/2021, 5:32 PM

## 2021-09-01 NOTE — ED Provider Notes (Signed)
[facility] EMERGENCY DEPARTMENT Provider Note   CSN: [id number] Arrival date & time: 08/31/21  2109     History  Chief Complaint  Patient presents with   Weakness    Cynthia Campbell is a 36 y.o. female.   Weakness Severity:  Moderate Onset quality:  Gradual Timing:  Intermittent Progression:  Waxing and waning Chronicity:  New Context: not alcohol use   Relieved by:  None tried Worsened by:  Nothing Ineffective treatments:  None tried Associated symptoms: sensory-motor deficit and stroke symptoms   Associated symptoms: no abdominal pain, no ataxia, no falls, no headaches, no nausea, no seizures and no vision change        Home Medications Prior to Admission medications   Medication Sig Start Date End Date Taking? Authorizing Provider  amphetamine-dextroamphetamine (ADDERALL) 20 MG tablet Take 20 mg by mouth daily as needed (attention/focusing).   Yes [provider]  diphenhydrAMINE (BENADRYL) 25 MG tablet Take 50 mg by mouth every 6 (six) hours as needed for allergies or sleep.   Yes [provider]  ibuprofen (ADVIL) 200 MG tablet Take 400 mg by mouth every 6 (six) hours as needed for headache or moderate pain.   Yes [provider]  Melatonin 10 MG TABS Take 20 mg by mouth at bedtime as needed (sleep).   Yes [provider]  mesalamine (CANASA) 1000 MG suppository Place 1,000 mg rectally once a week. No set day 12/05/20  Yes [provider]  Prenatal Vit-Fe Fumarate-FA (PRENATAL PO) Take 1 tablet by mouth daily.   Yes [provider]  benzonatate (TESSALON) 200 MG capsule Take 1 capsule (200 mg total) by mouth 2 (two) times daily as needed for cough. Patient not taking: Reported on 09/01/2021 08/05/21   Tempie Hoist, FNP  cephALEXin (KEFLEX) 500 MG capsule Take 1 capsule (500 mg total) by mouth 2 (two) times daily. Patient not taking: Reported on 09/01/2021 07/21/21   Chevis Pretty, FNP   oxyCODONE (OXY IR/ROXICODONE) 5 MG immediate release tablet Take 1 tablet (5 mg total) by mouth every 6 (six) hours as needed for moderate pain. Patient not taking: Reported on 09/01/2021 05/19/21   Jovita Kussmaul, MD      Allergies    Patient has no known allergies.    Review of Systems   Review of Systems  Gastrointestinal:  Negative for abdominal pain and nausea.  Musculoskeletal:  Negative for falls.  Neurological:  Positive for weakness. Negative for seizures and headaches.    Physical Exam Updated Vital Signs BP 123/88 (BP Location: Left Arm)   Pulse 73   Temp 97.9 F (36.6 C) (Oral)   Resp 16   SpO2 100%  Physical Exam Vitals and nursing note reviewed.  Constitutional:      Appearance: She is well-developed.  HENT:     Head: Normocephalic and atraumatic.     Mouth/Throat:     Mouth: Mucous membranes are moist.     Pharynx: Oropharynx is clear.  Eyes:     Pupils: Pupils are equal, round, and reactive to light.  Cardiovascular:     Rate and Rhythm: Normal rate and regular rhythm.  Pulmonary:     Effort: No respiratory distress.     Breath sounds: No stridor.  Abdominal:     General: Abdomen is flat. There is no distension.  Musculoskeletal:        General: No swelling or tenderness. Normal range of motion.     Cervical  back: Normal range of motion.  Skin:    General: Skin is warm and dry.  Neurological:     General: No focal deficit present.     Mental Status: She is alert.     ED Results / Procedures / Treatments   Labs (all labs ordered are listed, but only abnormal results are displayed) Labs Reviewed  URINALYSIS, COMPLETE (UACMP) WITH MICROSCOPIC - Abnormal; Notable for the following components:      Result Value   Leukocytes,Ua MODERATE (*)    All other components within normal limits  CBG MONITORING, ED - Abnormal; Notable for the following components:   Glucose-Capillary 101 (*)    All other components within normal limits  CBC  URINALYSIS,  ROUTINE W REFLEX MICROSCOPIC  COMPREHENSIVE METABOLIC PANEL  PREGNANCY, URINE  HEMOGLOBIN A1C  LIPID PANEL  ANA W/REFLEX IF POSITIVE  I-STAT BETA HCG BLOOD, ED ([facility], [facility], [facility] ONLY)    EKG EKG Interpretation  Date/Time:  Friday August 31 2021 21:42:17 EDT Ventricular Rate:  76 PR Interval:  164 QRS Duration: 102 QT Interval:  384 QTC Calculation: 432 R Axis:   78 Text Interpretation: Normal sinus rhythm Normal ECG No previous ECGs available Confirmed by Merrily Pew [phone]) on 09/01/2021 2:42:47 AM  Radiology CT HEAD WO CONTRAST  Result Date: 08/31/2021 CLINICAL DATA:  Neuro deficit, acute, stroke suspected. Left-sided weakness, numbness. EXAM: CT HEAD WITHOUT CONTRAST TECHNIQUE: Contiguous axial images were obtained from the base of the skull through the vertex without intravenous contrast. RADIATION DOSE REDUCTION: This exam was performed according to the departmental dose-optimization program which includes automated exposure control, adjustment of the mA and/or kV according to patient size and/or use of iterative reconstruction technique. COMPARISON:  None Available. 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 No hydrocephalus. No acute infarct or hemorrhage. Vascular: No hyperdense vessel or unexpected calcification. Skull: No acute calvarial abnormality. Sinuses/Orbits: No acute findings Other: None IMPRESSION: 4 mm colloid cyst.  No hydrocephalus. No acute intracranial abnormality. Electronically Signed   By: Rolm Baptise M.D.   On: 08/31/2021 23:06    Procedures Procedures    Medications Ordered in ED Medications - No data to display  ED Course/ Medical Decision Making/ A&P                           Medical Decision Making Risk Decision regarding hospitalization.   Waxing and waning neurologic symptoms over last few months. Most recently seem somewhat fatigable. Tonight husband noticed an asymmetric facial expression  and was concerned for stroke and brought her here for evaluation. Exam non focal. Neuro consulted. Examined patient and recommended multiple tests and observation for continued workup. Discussed with Dr. Hal Hope.    Final Clinical Impression(s) / ED Diagnoses Final diagnoses:  Weakness    Rx / DC Orders ED Discharge Orders     None         Spence Soberano, Corene Cornea, MD 09/01/21 [phone]

## 2021-09-01 NOTE — Evaluation (Signed)
Physical Therapy Evaluation and Discharge Patient Details Name: Cynthia Campbell MRN: [id number] DOB: 1985-08-12 Today's Date: 09/01/2021  History of Present Illness  36 y.o. Caucasian female presented to ER 09/01/21 for left-sided tingling. CT head no acute changes; MRI pending. PMH of ulcerative colitis on mesalamine, chronic chest tightness status post splenic cyst removal  Clinical Impression   Patient evaluated by Physical Therapy with no further acute PT needs identified. Patient is independent with ambulation. She scored 56/56 on Berg Balance Assessment (some items determined with clinical judgement). PT is signing off. Thank you for this referral.        Recommendations for follow up therapy are one component of a multi-disciplinary discharge planning process, led by the attending physician.  Recommendations may be updated based on patient status, additional functional criteria and insurance authorization.  Follow Up Recommendations No PT follow up    Assistance Recommended at Discharge None  Patient can return home with the following       Equipment Recommendations None recommended by PT  Recommendations for Other Services       Functional Status Assessment Patient has not had a recent decline in their functional status     Precautions / Restrictions Precautions Precautions: None Restrictions Weight Bearing Restrictions: No      Mobility  Bed Mobility Overal bed mobility: Independent             General bed mobility comments: on ED stretcher    Transfers Overall transfer level: Independent Equipment used: None               General transfer comment: from ED stretcher    Ambulation/Gait Ambulation/Gait assistance: Independent Gait Distance (Feet): 200 Feet Assistive device: None Gait Pattern/deviations: WFL(Within Functional Limits)   Gait velocity interpretation: >2.62 ft/sec, indicative of community ambulatory   General Gait Details: with  head turns with consistent velocity and stayed on straight path  Stairs            Wheelchair Mobility    Modified Rankin (Stroke Patients Only) Modified Rankin (Stroke Patients Only) Pre-Morbid Rankin Score: No significant disability Modified Rankin: No significant disability     Balance Overall balance assessment: Independent                               Standardized Balance Assessment Standardized Balance Assessment : Berg Balance Test, Dynamic Gait Index Berg Balance Test Sit to Stand: Able to stand without using hands and stabilize independently Standing Unsupported: Able to stand safely 2 minutes Sitting with Back Unsupported but Feet Supported on Floor or Stool: Able to sit safely and securely 2 minutes Stand to Sit: Sits safely with minimal use of hands Transfers: Able to transfer safely, minor use of hands Standing Unsupported with Eyes Closed: Able to stand 10 seconds safely Standing Ubsupported with Feet Together: Able to place feet together independently and stand 1 minute safely From Standing, Reach Forward with Outstretched Arm: Can reach confidently >25 cm (10") From Standing Position, Pick up Object from Floor: Able to pick up shoe safely and easily From Standing Position, Turn to Look Behind Over each Shoulder: Looks behind from both sides and weight shifts well Turn 360 Degrees: Able to turn 360 degrees safely in 4 seconds or less Standing Unsupported, Alternately Place Feet on Step/Stool: Able to stand independently and safely and complete 8 steps in 20 seconds Standing Unsupported, One Foot in Front: Able to place foot tandem  independently and hold 30 seconds Standing on One Leg: Able to lift leg independently and hold > 10 seconds Total Score: 56 Dynamic Gait Index Level Surface: Normal Change in Gait Speed: Normal Gait with Horizontal Head Turns: Normal Gait with Vertical Head Turns: Normal Gait and Pivot Turn: Normal        Pertinent Vitals/Pain Pain Assessment Pain Assessment: 0-10 Pain Score: 4  Pain Location: Rib pain, L chest to bicep Pain Descriptors / Indicators: Aching, Discomfort, Grimacing Pain Intervention(s): Repositioned, Limited activity within patient's tolerance    Home Living Family/patient expects to be discharged to:: Private residence Living Arrangements: Spouse/significant other;Children Available Help at Discharge: Family Type of Home: House Home Access: Stairs to enter Entrance Stairs-Rails: Can reach both Entrance Stairs-Number of Steps: 5 stairs Alternate Level Stairs-Number of Steps: full flight Home Layout: Two level;Able to live on main level with bedroom/bathroom Home Equipment: Shower seat - built in;Grab bars - toilet;Hand held shower head      Prior Function Prior Level of Function : Independent/Modified Independent;Working/employed               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     Hand Dominance   Dominant Hand: Right    Extremity/Trunk Assessment   Upper Extremity Assessment Upper Extremity Assessment: Defer to OT evaluation    Lower Extremity Assessment Lower Extremity Assessment: Overall WFL for tasks assessed    Cervical / Trunk Assessment Cervical / Trunk Assessment: Normal  Communication      Cognition Arousal/Alertness: Awake/alert Behavior During Therapy: WFL for tasks assessed/performed Overall Cognitive Status: Within Functional Limits for tasks assessed                                          General Comments General comments (skin integrity, edema, etc.): Portions of DGI not completed but anticipate pt would have scored 3 on those sections    Exercises     Assessment/Plan    PT Assessment Patient does not need any further PT services  PT Problem List         PT Treatment Interventions      PT Goals (Current goals can be found in the Care Plan section)  Acute Rehab PT Goals PT  Goal Formulation: All assessment and education complete, DC therapy    Frequency       Co-evaluation               AM-PAC PT "6 Clicks" Mobility  Outcome Measure Help needed turning from your back to your side while in a flat bed without using bedrails?: None Help needed moving from lying on your back to sitting on the side of a flat bed without using bedrails?: None Help needed moving to and from a bed to a chair (including a wheelchair)?: None Help needed standing up from a chair using your arms (e.g., wheelchair or bedside chair)?: None Help needed to walk in hospital room?: None Help needed climbing 3-5 steps with a railing? : None 6 Click Score: 24    End of Session Equipment Utilized During Treatment: Gait belt Activity Tolerance: Patient tolerated treatment well Patient left: in bed;with call bell/phone within reach;with family/visitor present   PT Visit Diagnosis: Other symptoms and signs involving the nervous system (Q76.226)    Time: 3335-4562 PT Time Calculation (min) (ACUTE ONLY): 15 min   Charges:  PT Evaluation $PT Eval Low Complexity: 1 Low           Arby Barrette, PT Acute Rehabilitation Services  Office [phone]   Rexanne Mano 09/01/2021, 1:46 PM

## 2021-09-03 LAB — ANA W/REFLEX IF POSITIVE: Anti Nuclear Antibody (ANA): NEGATIVE

## 2021-09-12 ENCOUNTER — Ambulatory Visit (INDEPENDENT_AMBULATORY_CARE_PROVIDER_SITE_OTHER): Payer: 59 | Admitting: Psychiatry

## 2021-09-12 ENCOUNTER — Encounter: Payer: Self-pay | Admitting: Psychiatry

## 2021-09-12 VITALS — BP 120/83 | HR 84 | Ht 70.0 in | Wt 163.0 lb

## 2021-09-12 DIAGNOSIS — R29810 Facial weakness: Secondary | ICD-10-CM

## 2021-09-12 DIAGNOSIS — G459 Transient cerebral ischemic attack, unspecified: Secondary | ICD-10-CM | POA: Diagnosis not present

## 2021-09-12 NOTE — Patient Instructions (Addendum)
Can take magnesium daily to help prevent paresthesias

## 2021-09-12 NOTE — Progress Notes (Signed)
[facility]  PATIENT: Cynthia Campbell DOB: [birth year]  REFERRING CLINICIAN: Faustino Congress, NP HISTORY FROM: self REASON FOR VISIT: left sided paresthesias   HISTORICAL  CHIEF COMPLAINT:  Chief Complaint  Patient presents with   New Patient (Initial Visit)    Pt reports she has been having stroke like symptoms such as facial weakness that didn't last long. She states her face still felt tired but she could still smile. She states it only happened 1 time but her face was still sensitive after a couple days.Pt asking for preventive care and what could possibly cause it. Room 8, alone    HISTORY OF PRESENT ILLNESS:  The patient presents for evaluation of paresthesias which began in January 2023. They improved after her spleen cyst removal in February. Then last week she developed a sensation of her face feeling tired and a sense of internal vibration. She then noticed a facial droop on the left side. She called EMS and face movement was back to normal by the time they arrived. She also developed photophobia. No headache, nausea, or phonophobia. Notes that she was anxious at the time. In the ED MRI and MRA were done. 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 MRA was unremarkable. LDL was 63 and A1c  was 4.9. She left before TTE could be completed. Photophobia lasted for ~2 days after ED discharge. She has not had any other symptoms since her ED visit.  She has never been diagnosed with migraine. Does not typically get headaches. Does have a strong family history of migraine.   OTHER MEDICAL CONDITIONS: ulcerative colitis, anxiety, GERD, s/p spleen cyst removal   REVIEW OF SYSTEMS: Full 14 system review of systems performed and negative with exception of: left face weakness, paresthesias, photophobia  ALLERGIES: No Known Allergies  HOME MEDICATIONS: Outpatient Medications Prior to Visit  Medication Sig Dispense Refill    amphetamine-dextroamphetamine (ADDERALL) 20 MG tablet Take 20 mg by mouth daily as needed (attention/focusing).     diphenhydrAMINE (BENADRYL) 25 MG tablet Take 50 mg by mouth every 6 (six) hours as needed for allergies or sleep.     ibuprofen (ADVIL) 200 MG tablet Take 400 mg by mouth every 6 (six) hours as needed for headache or moderate pain.     Melatonin 10 MG TABS Take 20 mg by mouth at bedtime as needed (sleep).     mesalamine (CANASA) 1000 MG suppository Place 1,000 mg rectally once a week. No set day     Prenatal Vit-Fe Fumarate-FA (PRENATAL PO) Take 1 tablet by mouth daily.     No facility-administered medications prior to visit.    PAST MEDICAL HISTORY: Past Medical History:  Diagnosis Date   Anxiety    GERD (gastroesophageal reflux disease)    Medical history non-contributory    Ulcerative colitis ([place])     PAST SURGICAL HISTORY: Past Surgical History:  Procedure Laterality Date   COLONOSCOPY     LAPAROSCOPIC SPLENECTOMY N/A 05/18/2021   Procedure: LAPAROSCOPIC SPLENECTOMY;  Surgeon: Kinsinger, Arta Bruce, MD;  Location: [facility];  Service: General;  Laterality: N/A;   WISDOM TOOTH EXTRACTION      FAMILY HISTORY: Family History  Problem Relation Age of Onset   Osteoporosis Mother    High blood pressure Father    Hypothyroidism Sister    Stroke Paternal Grandmother     SOCIAL HISTORY: Social History   Socioeconomic History   Marital status: Married    Spouse name: Not on file  Number of children: Not on file   Years of education: Not on file   Highest education level: Not on file  Occupational History   Not on file  Tobacco Use   Smoking status: Never   Smokeless tobacco: Never  Vaping Use   Vaping Use: Never used  Substance and Sexual Activity   Alcohol use: Yes    Comment: Social   Drug use: Never   Sexual activity: Yes  Other Topics Concern   Not on file  Social History Narrative   Right handed   Caffeine intake 1 cup per day   Social  Determinants of Health   Financial Resource Strain: Not on file  Food Insecurity: Not on file  Transportation Needs: Not on file  Physical Activity: Not on file  Stress: Not on file  Social Connections: Not on file  Intimate Partner Violence: Not on file     PHYSICAL EXAM   GENERAL EXAM/CONSTITUTIONAL: Vitals:  Vitals:   09/12/21 1532  BP: 120/83  Pulse: 84  Weight: 163 lb (73.9 kg)  Height: '5\' 10"'$  (1.778 m)   Body mass index is 23.39 kg/m. Wt Readings from Last 3 Encounters:  09/12/21 163 lb (73.9 kg)  05/18/21 151 lb 9.6 oz (68.8 kg)  05/09/21 151 lb 9.6 oz (68.8 kg)    NEUROLOGIC: MENTAL STATUS:   awake, alert, oriented to person, place and time recent and remote memory intact normal attention and concentration language fluent, comprehension intact, naming intact fund of knowledge appropriate  CRANIAL NERVE:  2nd, 3rd, 4th, 6th - pupils equal and reactive to light, visual fields full to confrontation, extraocular muscles intact, no nystagmus 5th - facial sensation symmetric 7th - facial strength symmetric 8th - hearing intact 9th - palate elevates symmetrically, uvula midline 11th - shoulder shrug symmetric 12th - tongue protrusion midline  MOTOR:  normal bulk and tone, full strength in the BUE, BLE  SENSORY:  normal and symmetric to light touch all 4 extremities  COORDINATION:  finger-nose-finger intact bilaterally  REFLEXES:  deep tendon reflexes present and symmetric  GAIT/STATION:  normal     DIAGNOSTIC DATA (LABS, IMAGING, TESTING) - I reviewed patient records, labs, notes, testing and imaging myself where available.  Lab Results  Component Value Date   WBC 8.8 08/31/2021   HGB 13.6 08/31/2021   HCT 39.0 08/31/2021   MCV 90.5 08/31/2021   PLT 388 08/31/2021      Component Value Date/Time   NA 138 08/31/2021 2143   K 3.7 08/31/2021 2143   CL 106 08/31/2021 2143   CO2 24 08/31/2021 2143   GLUCOSE 97 08/31/2021 2143   BUN 17  08/31/2021 2143   CREATININE 0.79 08/31/2021 2143   CALCIUM 9.3 08/31/2021 2143   PROT 7.1 08/31/2021 2143   ALBUMIN 4.2 08/31/2021 2143   AST 17 08/31/2021 2143   ALT 18 08/31/2021 2143   ALKPHOS 59 08/31/2021 2143   BILITOT 0.6 08/31/2021 2143   GFRNONAA >60 08/31/2021 2143   Lab Results  Component Value Date   CHOL 141 09/01/2021   HDL 71 09/01/2021   LDLCALC 63 09/01/2021   TRIG 35 09/01/2021   CHOLHDL 2.0 09/01/2021   Lab Results  Component Value Date   HGBA1C 4.9 09/01/2021   No results found for: "VITAMINB12" Lab Results  Component Value Date   TSH 1.536 09/01/2021    MRA head 09/02/21 unremarkable MRI brain 09/02/21 with 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 year old female with a history of ulcerative colitis, anxiety, GERD, s/p spleen cyst removal who presents for evaluation of transient left sided facial weakness and paresthesias. MRA unremarkable. 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 Will repeat MRI in one year to assess for stability. Workup has been negative for vascular risk factors. Will order TTE to complete TIA workup. It's possible this was a migraine aura as these can present with paresthesias and weakness, and she did have associated photophobia lasting 2 days. However it is difficult to say for certain given no associated headache or history of migraine. There may have been an additional anxiety component as she reports internal vibrations and anxiety related to her symptoms. Will monitor for now to see if symptoms reoccur or worsen.     PLAN: -TTE -Repeat MRI brain in one year to monitor colloid cyst  Orders Placed This Encounter  Procedures   ECHOCARDIOGRAM COMPLETE BUBBLE STUDY    Return in about 1 year (around 09/13/2022).    Genia Harold, MD 09/12/21 4:12 PM  I spent an average of 42 minutes chart reviewing and counseling the patient, with at least  50% of the time face to face with the patient.   [facility] [address] [place], [place] [address] [phone]

## 2021-10-08 ENCOUNTER — Telehealth: Payer: 59 | Admitting: Physician Assistant

## 2021-10-08 DIAGNOSIS — N898 Other specified noninflammatory disorders of vagina: Secondary | ICD-10-CM

## 2021-10-08 DIAGNOSIS — Z3200 Encounter for pregnancy test, result unknown: Secondary | ICD-10-CM

## 2021-10-08 NOTE — Progress Notes (Signed)
Because of grey discharge and uncertainty of pregnancy status, I feel your condition warrants further evaluation and I recommend that you be seen for a face to face visit.  Please contact your primary care physician practice to be seen. Many offices offer virtual options to be seen via video if you are not comfortable going in person to a medical facility at this time.  NOTE: You will NOT be charged for this eVisit.  If you do not have a PCP, [facility] offers a free physician referral service available at [phone]. Our trained staff has the experience, knowledge and resources to put you in touch with a physician who is right for you.    If you are having a true medical emergency please call 911.   Your e-visit answers were reviewed by a board certified advanced clinical practitioner to complete your personal care plan.  Thank you for using e-Visits.

## 2021-10-10 DIAGNOSIS — Z113 Encounter for screening for infections with a predominantly sexual mode of transmission: Secondary | ICD-10-CM | POA: Diagnosis not present

## 2021-10-10 DIAGNOSIS — N760 Acute vaginitis: Secondary | ICD-10-CM | POA: Diagnosis not present

## 2021-10-23 ENCOUNTER — Encounter: Payer: Self-pay | Admitting: Psychiatry

## 2021-11-20 ENCOUNTER — Telehealth: Payer: 59 | Admitting: Nurse Practitioner

## 2021-11-20 DIAGNOSIS — U071 COVID-19: Secondary | ICD-10-CM

## 2021-11-20 MED ORDER — FLUTICASONE PROPIONATE 50 MCG/ACT NA SUSP: 2.0000 | Freq: Every day | NASAL | 6 refills | Status: DC

## 2021-11-20 MED ORDER — BENZONATATE 100 MG PO CAPS: 100.0000 mg | ORAL_CAPSULE | Freq: Three times a day (TID) | ORAL | 0 refills | Status: DC | PRN

## 2021-11-20 NOTE — Progress Notes (Signed)
Charlestine,  Thank you for the additional information. If you would like to be evaluated for anti-viral medication you will need to schedule a virtual visit (video) visit with one of our providers for evaluation. Otherwise here is some guidance below:     E-Visit  for Positive Covid Test Result  We are sorry you are not feeling well. We are here to help!  You have tested positive for COVID-19, meaning that you were infected with the novel coronavirus and could give the virus to others.  It is vitally important that you stay home so you do not spread it to others.      Please continue isolation at home, for at least 5 days since the start of your symptoms and until you have had 24 hours with no fever (without taking a fever reducer) and with improving of symptoms.  If you have no symptoms but tested positive (or all symptoms resolve after 5 days and you have no fever) you can leave your house but continue to wear a mask around others for an additional 5 days. If you have a fever,continue to stay home until you have had 24 hours of no fever. Most cases improve 5-10 days from onset but we have seen a small number of patients who have gotten worse after the 10 days.  Please be sure to watch for worsening symptoms and remain taking the proper precautions.   Go to the nearest hospital ED for assessment if fever/cough/breathlessness are severe or illness seems like a threat to life.    The following symptoms may appear 2-14 days after exposure: Fever Cough Shortness of breath or difficulty breathing Chills Repeated shaking with chills Muscle pain Headache Sore throat New loss of taste or smell Fatigue Congestion or runny nose Nausea or vomiting Diarrhea  You have been enrolled in [facility] for COVID-19. Daily you will receive a questionnaire within the [facility] website. Our COVID-19 response team will be monitoring your responses daily.  You can use medication such as  prescription cough medication called Tessalon Perles 100 mg. You may take 1-2 capsules every 8 hours as needed for cough and prescription for Fluticasone nasal spray 2 sprays in each nostril one time per day  You may also take acetaminophen (Tylenol) as needed for fever.  HOME CARE: Only take medications as instructed by your medical team. Drink plenty of fluids and get plenty of rest. A steam or ultrasonic humidifier can help if you have congestion.   GET HELP RIGHT AWAY IF YOU HAVE EMERGENCY WARNING SIGNS.  Call 911 or proceed to your closest emergency facility if: You develop worsening high fever. Trouble breathing Bluish lips or face Persistent pain or pressure in the chest New confusion Inability to wake or stay awake You cough up blood. Your symptoms become more severe Inability to hold down food or fluids  This list is not all possible symptoms. Contact your medical provider for any symptoms that are severe or concerning to you.    Your e-visit answers were reviewed by a board certified advanced clinical practitioner to complete your personal care plan.  Depending on the condition, your plan could have included both over the counter or prescription medications.  If there is a problem please reply once you have received a response from your provider.  Your safety is important to [place].  If you have drug allergies check your prescription carefully.    You can use MyChart to ask questions about today's visit, request a  non-urgent call back, or ask for a work or school excuse for 24 hours related to this e-Visit. If it has been greater than 24 hours you will need to follow up with your provider, or enter a new e-Visit to address those concerns. You will get an e-mail in the next two days asking about your experience.  I hope that your e-visit has been valuable and will speed your recovery. Thank you for using e-visits.  I spent approximately 7 minutes reviewing the patient's history,  current symptoms and coordinating their plan of care today.    Meds ordered this encounter  Medications   benzonatate (TESSALON) 100 MG capsule    Sig: Take 1 capsule (100 mg total) by mouth 3 (three) times daily as needed.    Dispense:  30 capsule    Refill:  0   fluticasone (FLONASE) 50 MCG/ACT nasal spray    Sig: Place 2 sprays into both nostrils daily.    Dispense:  16 g    Refill:  6

## 2021-11-28 ENCOUNTER — Ambulatory Visit (HOSPITAL_COMMUNITY)
Admission: RE | Admit: 2021-11-28 | Discharge: 2021-11-28 | Disposition: A | Discharge status: Home / Self Care | Payer: 59 | Source: Ambulatory Visit | Attending: Psychiatry | Admitting: Psychiatry

## 2021-11-28 DIAGNOSIS — R29810 Facial weakness: Secondary | ICD-10-CM | POA: Insufficient documentation

## 2021-11-28 DIAGNOSIS — G459 Transient cerebral ischemic attack, unspecified: Secondary | ICD-10-CM

## 2021-11-28 LAB — ECHOCARDIOGRAM COMPLETE BUBBLE STUDY
AR max vel: 3.04 cm2
AV Area VTI: 2.82 cm2
AV Area mean vel: 2.88 cm2
AV Mean grad: 4 mmHg
AV Peak grad: 7.6 mmHg
Ao pk vel: 1.38 m/s
Area-P 1/2: 3.37 cm2
S' Lateral: 3.3 cm

## 2022-01-10 DIAGNOSIS — Z319 Encounter for procreative management, unspecified: Secondary | ICD-10-CM | POA: Diagnosis not present

## 2022-01-10 DIAGNOSIS — E559 Vitamin D deficiency, unspecified: Secondary | ICD-10-CM | POA: Diagnosis not present

## 2022-01-16 DIAGNOSIS — Z3141 Encounter for fertility testing: Secondary | ICD-10-CM | POA: Diagnosis not present

## 2022-01-17 DIAGNOSIS — Z319 Encounter for procreative management, unspecified: Secondary | ICD-10-CM | POA: Diagnosis not present

## 2022-01-24 ENCOUNTER — Telehealth: Payer: Self-pay | Admitting: Psychiatry

## 2022-01-24 NOTE — Telephone Encounter (Signed)
6/24 appt moved from Dr. Billey Gosling to Amy- Dr. Billey Gosling out on leave.

## 2022-02-05 DIAGNOSIS — K219 Gastro-esophageal reflux disease without esophagitis: Secondary | ICD-10-CM | POA: Diagnosis not present

## 2022-02-05 DIAGNOSIS — K5120 Ulcerative (chronic) proctitis without complications: Secondary | ICD-10-CM | POA: Diagnosis not present

## 2022-02-07 DIAGNOSIS — K219 Gastro-esophageal reflux disease without esophagitis: Secondary | ICD-10-CM | POA: Diagnosis not present

## 2022-02-07 DIAGNOSIS — K5120 Ulcerative (chronic) proctitis without complications: Secondary | ICD-10-CM | POA: Diagnosis not present

## 2022-02-27 DIAGNOSIS — D2271 Melanocytic nevi of right lower limb, including hip: Secondary | ICD-10-CM | POA: Diagnosis not present

## 2022-02-27 DIAGNOSIS — D2261 Melanocytic nevi of right upper limb, including shoulder: Secondary | ICD-10-CM | POA: Diagnosis not present

## 2022-02-27 DIAGNOSIS — D2262 Melanocytic nevi of left upper limb, including shoulder: Secondary | ICD-10-CM | POA: Diagnosis not present

## 2022-02-27 DIAGNOSIS — D2239 Melanocytic nevi of other parts of face: Secondary | ICD-10-CM | POA: Diagnosis not present

## 2022-02-27 DIAGNOSIS — D2272 Melanocytic nevi of left lower limb, including hip: Secondary | ICD-10-CM | POA: Diagnosis not present

## 2022-02-27 DIAGNOSIS — D225 Melanocytic nevi of trunk: Secondary | ICD-10-CM | POA: Diagnosis not present

## 2022-02-27 DIAGNOSIS — D2221 Melanocytic nevi of right ear and external auricular canal: Secondary | ICD-10-CM | POA: Diagnosis not present

## 2022-02-27 DIAGNOSIS — D224 Melanocytic nevi of scalp and neck: Secondary | ICD-10-CM | POA: Diagnosis not present

## 2022-02-27 DIAGNOSIS — L821 Other seborrheic keratosis: Secondary | ICD-10-CM | POA: Diagnosis not present

## 2022-03-21 DIAGNOSIS — N760 Acute vaginitis: Secondary | ICD-10-CM | POA: Diagnosis not present

## 2022-03-21 DIAGNOSIS — Z01419 Encounter for gynecological examination (general) (routine) without abnormal findings: Secondary | ICD-10-CM | POA: Diagnosis not present

## 2022-03-25 NOTE — L&D Delivery Note (Signed)
DELIVERY NOTE  Pt complete and at +2 station with urge to push. Epidural controlling pain. Pt pushed and delivered a viable female infant in LOA position. Loose nuchal x1, reduced at perineum. Anterior and posterior shoulders spontaneously delivered with next two pushes; body easily followed next. Infant placed on mothers abdomen and bulb suction of mouth and nose performed. Cord was then clamped and cut by both sisters. Cord blood obtained, 3VC. Baby had a vigorous spontaneous cry noted. Placenta then delivered at 2034 intact. Fundal massage performed and pitocin per protocol. Fundus firm. The following lacerations were noted: perineal abrasion. Made hemostatic (ooze) in routine fashion with 2-0 vicryl with figure of eight. Mother and baby stable. Counts correct   Infant time: 2028 Gender: female, desires circ Placenta time: 2034 Apgars: 8/9 Weight: pending skin-to-skin

## 2022-06-06 DIAGNOSIS — Z6823 Body mass index (BMI) 23.0-23.9, adult: Secondary | ICD-10-CM | POA: Diagnosis not present

## 2022-06-06 DIAGNOSIS — G479 Sleep disorder, unspecified: Secondary | ICD-10-CM | POA: Diagnosis not present

## 2022-06-06 DIAGNOSIS — F419 Anxiety disorder, unspecified: Secondary | ICD-10-CM | POA: Diagnosis not present

## 2022-06-06 DIAGNOSIS — R591 Generalized enlarged lymph nodes: Secondary | ICD-10-CM | POA: Diagnosis not present

## 2022-06-06 DIAGNOSIS — F902 Attention-deficit hyperactivity disorder, combined type: Secondary | ICD-10-CM | POA: Diagnosis not present

## 2022-06-06 DIAGNOSIS — Z9081 Acquired absence of spleen: Secondary | ICD-10-CM | POA: Diagnosis not present

## 2022-06-10 ENCOUNTER — Other Ambulatory Visit: Payer: Self-pay | Admitting: Obstetrics and Gynecology

## 2022-06-10 DIAGNOSIS — N6320 Unspecified lump in the left breast, unspecified quadrant: Secondary | ICD-10-CM

## 2022-07-03 ENCOUNTER — Ambulatory Visit: Admission: RE | Admit: 2022-07-03 | Payer: Commercial Managed Care - PPO | Source: Ambulatory Visit

## 2022-07-03 ENCOUNTER — Ambulatory Visit
Admission: RE | Admit: 2022-07-03 | Discharge: 2022-07-03 | Disposition: A | Discharge status: Home / Self Care | Payer: 59 | Source: Ambulatory Visit | Attending: Obstetrics and Gynecology | Admitting: Obstetrics and Gynecology

## 2022-07-03 DIAGNOSIS — R921 Mammographic calcification found on diagnostic imaging of breast: Secondary | ICD-10-CM | POA: Diagnosis not present

## 2022-07-03 DIAGNOSIS — N6320 Unspecified lump in the left breast, unspecified quadrant: Secondary | ICD-10-CM

## 2022-07-22 DIAGNOSIS — Z3201 Encounter for pregnancy test, result positive: Secondary | ICD-10-CM | POA: Diagnosis not present

## 2022-07-22 DIAGNOSIS — Z3480 Encounter for supervision of other normal pregnancy, unspecified trimester: Secondary | ICD-10-CM | POA: Diagnosis not present

## 2022-07-22 DIAGNOSIS — Z113 Encounter for screening for infections with a predominantly sexual mode of transmission: Secondary | ICD-10-CM | POA: Diagnosis not present

## 2022-07-22 DIAGNOSIS — N925 Other specified irregular menstruation: Secondary | ICD-10-CM | POA: Diagnosis not present

## 2022-08-08 DIAGNOSIS — O209 Hemorrhage in early pregnancy, unspecified: Secondary | ICD-10-CM | POA: Diagnosis not present

## 2022-08-20 DIAGNOSIS — O360921 Maternal care for other rhesus isoimmunization, second trimester, fetus 1: Secondary | ICD-10-CM | POA: Diagnosis not present

## 2022-08-20 DIAGNOSIS — Z3480 Encounter for supervision of other normal pregnancy, unspecified trimester: Secondary | ICD-10-CM | POA: Diagnosis not present

## 2022-08-20 DIAGNOSIS — Z369 Encounter for antenatal screening, unspecified: Secondary | ICD-10-CM | POA: Diagnosis not present

## 2022-08-20 DIAGNOSIS — O09521 Supervision of elderly multigravida, first trimester: Secondary | ICD-10-CM | POA: Diagnosis not present

## 2022-08-20 DIAGNOSIS — Z3490 Encounter for supervision of normal pregnancy, unspecified, unspecified trimester: Secondary | ICD-10-CM | POA: Diagnosis not present

## 2022-08-20 LAB — OB RESULTS CONSOLE RUBELLA ANTIBODY, IGM: Rubella: IMMUNE

## 2022-08-20 LAB — OB RESULTS CONSOLE HIV ANTIBODY (ROUTINE TESTING): HIV: NONREACTIVE

## 2022-08-20 LAB — OB RESULTS CONSOLE RPR: RPR: NONREACTIVE

## 2022-08-20 LAB — HEPATITIS C ANTIBODY: HCV Ab: NEGATIVE

## 2022-08-20 LAB — OB RESULTS CONSOLE GC/CHLAMYDIA
Chlamydia: NEGATIVE
Neisseria Gonorrhea: NEGATIVE

## 2022-08-20 LAB — OB RESULTS CONSOLE HEPATITIS B SURFACE ANTIGEN: Hepatitis B Surface Ag: NEGATIVE

## 2022-09-16 ENCOUNTER — Ambulatory Visit: Payer: 59 | Admitting: Psychiatry

## 2022-09-16 DIAGNOSIS — Z3482 Encounter for supervision of other normal pregnancy, second trimester: Secondary | ICD-10-CM | POA: Diagnosis not present

## 2022-09-16 DIAGNOSIS — Z369 Encounter for antenatal screening, unspecified: Secondary | ICD-10-CM | POA: Diagnosis not present

## 2022-10-16 ENCOUNTER — Ambulatory Visit: Payer: 59 | Admitting: Family Medicine

## 2022-10-16 DIAGNOSIS — Z363 Encounter for antenatal screening for malformations: Secondary | ICD-10-CM | POA: Diagnosis not present

## 2022-10-16 DIAGNOSIS — Z3A19 19 weeks gestation of pregnancy: Secondary | ICD-10-CM | POA: Diagnosis not present

## 2022-11-15 DIAGNOSIS — Z369 Encounter for antenatal screening, unspecified: Secondary | ICD-10-CM | POA: Diagnosis not present

## 2022-11-15 DIAGNOSIS — Z3A23 23 weeks gestation of pregnancy: Secondary | ICD-10-CM | POA: Diagnosis not present

## 2022-12-11 DIAGNOSIS — Z113 Encounter for screening for infections with a predominantly sexual mode of transmission: Secondary | ICD-10-CM | POA: Diagnosis not present

## 2022-12-11 DIAGNOSIS — D485 Neoplasm of uncertain behavior of skin: Secondary | ICD-10-CM | POA: Diagnosis not present

## 2022-12-11 DIAGNOSIS — Z3480 Encounter for supervision of other normal pregnancy, unspecified trimester: Secondary | ICD-10-CM | POA: Diagnosis not present

## 2022-12-11 LAB — OB RESULTS CONSOLE RPR: RPR: NONREACTIVE

## 2022-12-11 LAB — OB RESULTS CONSOLE GC/CHLAMYDIA
Chlamydia: NEGATIVE
Neisseria Gonorrhea: NEGATIVE

## 2022-12-11 LAB — OB RESULTS CONSOLE HIV ANTIBODY (ROUTINE TESTING): HIV: NONREACTIVE

## 2023-01-01 DIAGNOSIS — Z23 Encounter for immunization: Secondary | ICD-10-CM | POA: Diagnosis not present

## 2023-01-01 DIAGNOSIS — O360990 Maternal care for other rhesus isoimmunization, unspecified trimester, not applicable or unspecified: Secondary | ICD-10-CM | POA: Diagnosis not present

## 2023-01-15 DIAGNOSIS — Z23 Encounter for immunization: Secondary | ICD-10-CM | POA: Diagnosis not present

## 2023-01-28 DIAGNOSIS — Z369 Encounter for antenatal screening, unspecified: Secondary | ICD-10-CM | POA: Diagnosis not present

## 2023-02-10 DIAGNOSIS — Z3483 Encounter for supervision of other normal pregnancy, third trimester: Secondary | ICD-10-CM | POA: Diagnosis not present

## 2023-02-10 DIAGNOSIS — Z3482 Encounter for supervision of other normal pregnancy, second trimester: Secondary | ICD-10-CM | POA: Diagnosis not present

## 2023-02-12 DIAGNOSIS — Z369 Encounter for antenatal screening, unspecified: Secondary | ICD-10-CM | POA: Diagnosis not present

## 2023-02-12 DIAGNOSIS — Z3480 Encounter for supervision of other normal pregnancy, unspecified trimester: Secondary | ICD-10-CM | POA: Diagnosis not present

## 2023-02-14 LAB — OB RESULTS CONSOLE GBS: GBS: NEGATIVE

## 2023-02-17 DIAGNOSIS — Z369 Encounter for antenatal screening, unspecified: Secondary | ICD-10-CM | POA: Diagnosis not present

## 2023-02-23 ENCOUNTER — Inpatient Hospital Stay (HOSPITAL_COMMUNITY): Payer: 59 | Admitting: Anesthesiology

## 2023-02-23 ENCOUNTER — Encounter (HOSPITAL_COMMUNITY): Payer: Self-pay

## 2023-02-23 ENCOUNTER — Other Ambulatory Visit: Payer: Self-pay

## 2023-02-23 ENCOUNTER — Inpatient Hospital Stay (HOSPITAL_COMMUNITY)
Admission: AD | Admit: 2023-02-23 | Discharge: 2023-02-25 | DRG: 807 | Disposition: A | Discharge status: Home / Self Care | Payer: 59 | Attending: Obstetrics and Gynecology | Admitting: Obstetrics and Gynecology

## 2023-02-23 DIAGNOSIS — Z6791 Unspecified blood type, Rh negative: Secondary | ICD-10-CM | POA: Diagnosis not present

## 2023-02-23 DIAGNOSIS — Z23 Encounter for immunization: Secondary | ICD-10-CM | POA: Diagnosis not present

## 2023-02-23 DIAGNOSIS — Z9081 Acquired absence of spleen: Secondary | ICD-10-CM | POA: Diagnosis not present

## 2023-02-23 DIAGNOSIS — O4292 Full-term premature rupture of membranes, unspecified as to length of time between rupture and onset of labor: Principal | ICD-10-CM | POA: Diagnosis present

## 2023-02-23 DIAGNOSIS — Z8262 Family history of osteoporosis: Secondary | ICD-10-CM | POA: Diagnosis not present

## 2023-02-23 DIAGNOSIS — Z3A38 38 weeks gestation of pregnancy: Secondary | ICD-10-CM

## 2023-02-23 DIAGNOSIS — O9962 Diseases of the digestive system complicating childbirth: Secondary | ICD-10-CM | POA: Diagnosis present

## 2023-02-23 DIAGNOSIS — O4290 Premature rupture of membranes, unspecified as to length of time between rupture and onset of labor, unspecified weeks of gestation: Principal | ICD-10-CM | POA: Diagnosis present

## 2023-02-23 DIAGNOSIS — O9902 Anemia complicating childbirth: Secondary | ICD-10-CM | POA: Diagnosis present

## 2023-02-23 DIAGNOSIS — O26893 Other specified pregnancy related conditions, third trimester: Secondary | ICD-10-CM | POA: Diagnosis not present

## 2023-02-23 DIAGNOSIS — K219 Gastro-esophageal reflux disease without esophagitis: Secondary | ICD-10-CM | POA: Diagnosis not present

## 2023-02-23 DIAGNOSIS — Z823 Family history of stroke: Secondary | ICD-10-CM | POA: Diagnosis not present

## 2023-02-23 LAB — CBC
HCT: 42.9 % (ref 36.0–46.0)
Hemoglobin: 14.5 g/dL (ref 12.0–15.0)
MCH: 31 pg (ref 26.0–34.0)
MCHC: 33.8 g/dL (ref 30.0–36.0)
MCV: 91.9 fL (ref 80.0–100.0)
Platelets: 388 10*3/uL (ref 150–400)
RBC: 4.67 MIL/uL (ref 3.87–5.11)
RDW: 14.1 % (ref 11.5–15.5)
WBC: 14.8 10*3/uL — ABNORMAL HIGH (ref 4.0–10.5)
nRBC: 0 % (ref 0.0–0.2)

## 2023-02-23 LAB — TYPE AND SCREEN
ABO/RH(D): O NEG
Antibody Screen: POSITIVE

## 2023-02-23 LAB — RUPTURE OF MEMBRANE (ROM)PLUS: Rom Plus: POSITIVE

## 2023-02-23 LAB — POCT FERN TEST: POCT Fern Test: NEGATIVE

## 2023-02-23 MED ORDER — DIPHENHYDRAMINE HCL 25 MG PO CAPS: 25.0000 mg | ORAL_CAPSULE | Freq: Four times a day (QID) | ORAL | Status: DC | PRN

## 2023-02-23 MED ORDER — ONDANSETRON HCL 4 MG/2ML IJ SOLN: 4.0000 mg | Freq: Four times a day (QID) | INTRAMUSCULAR | Status: DC | PRN

## 2023-02-23 MED ORDER — ZOLPIDEM TARTRATE 5 MG PO TABS: 5.0000 mg | ORAL_TABLET | Freq: Every evening | ORAL | Status: DC | PRN

## 2023-02-23 MED ORDER — FENTANYL-BUPIVACAINE-NACL 0.5-0.125-0.9 MG/250ML-% EP SOLN
12.0000 mL/h | EPIDURAL | Status: DC | PRN
  Administered 2023-02-23: 12 mL/h via EPIDURAL
  Filled 2023-02-23: qty 250

## 2023-02-23 MED ORDER — SOD CITRATE-CITRIC ACID 500-334 MG/5ML PO SOLN: 30.0000 mL | ORAL | Status: DC | PRN

## 2023-02-23 MED ORDER — OXYTOCIN-SODIUM CHLORIDE 30-0.9 UT/500ML-% IV SOLN
2.5000 [IU]/h | INTRAVENOUS | Status: DC
  Administered 2023-02-23: 2.5 [IU]/h via INTRAVENOUS
  Filled 2023-02-23: qty 500

## 2023-02-23 MED ORDER — PHENYLEPHRINE 80 MCG/ML (10ML) SYRINGE FOR IV PUSH (FOR BLOOD PRESSURE SUPPORT): 80.0000 ug | PREFILLED_SYRINGE | INTRAVENOUS | Status: DC | PRN

## 2023-02-23 MED ORDER — ACETAMINOPHEN 325 MG PO TABS
650.0000 mg | ORAL_TABLET | ORAL | Status: DC | PRN
  Administered 2023-02-23: 650 mg via ORAL
  Filled 2023-02-23: qty 2

## 2023-02-23 MED ORDER — EPHEDRINE 5 MG/ML INJ
10.0000 mg | INTRAVENOUS | Status: DC | PRN
Start: 2023-02-23 — End: 2023-02-23

## 2023-02-23 MED ORDER — FENTANYL CITRATE (PF) 100 MCG/2ML IJ SOLN: 50.0000 ug | INTRAMUSCULAR | Status: DC | PRN

## 2023-02-23 MED ORDER — OXYTOCIN BOLUS FROM INFUSION
333.0000 mL | Freq: Once | INTRAVENOUS | Status: AC
  Administered 2023-02-23: 333 mL via INTRAVENOUS

## 2023-02-23 MED ORDER — DIPHENHYDRAMINE HCL 50 MG/ML IJ SOLN: 12.5000 mg | INTRAMUSCULAR | Status: DC | PRN

## 2023-02-23 MED ORDER — LACTATED RINGERS IV SOLN: 500.0000 mL | INTRAVENOUS | Status: DC | PRN

## 2023-02-23 MED ORDER — OXYCODONE-ACETAMINOPHEN 5-325 MG PO TABS: 2.0000 | ORAL_TABLET | ORAL | Status: DC | PRN

## 2023-02-23 MED ORDER — IBUPROFEN 600 MG PO TABS
600.0000 mg | ORAL_TABLET | Freq: Four times a day (QID) | ORAL | Status: DC
Start: 2023-02-24 — End: 2023-02-25
  Administered 2023-02-23 – 2023-02-25 (×7): 600 mg via ORAL
  Filled 2023-02-23 (×7): qty 1

## 2023-02-23 MED ORDER — SIMETHICONE 80 MG PO CHEW: 80.0000 mg | CHEWABLE_TABLET | ORAL | Status: DC | PRN

## 2023-02-23 MED ORDER — TRANEXAMIC ACID-NACL 1000-0.7 MG/100ML-% IV SOLN: 1000.0000 mg | INTRAVENOUS | Status: DC

## 2023-02-23 MED ORDER — DIBUCAINE (PERIANAL) 1 % EX OINT: 1.0000 | TOPICAL_OINTMENT | CUTANEOUS | Status: DC | PRN

## 2023-02-23 MED ORDER — LIDOCAINE HCL (PF) 1 % IJ SOLN: 30.0000 mL | INTRAMUSCULAR | Status: DC | PRN

## 2023-02-23 MED ORDER — LACTATED RINGERS IV SOLN: INTRAVENOUS | Status: DC

## 2023-02-23 MED ORDER — OXYCODONE-ACETAMINOPHEN 5-325 MG PO TABS: 1.0000 | ORAL_TABLET | ORAL | Status: DC | PRN

## 2023-02-23 MED ORDER — ACETAMINOPHEN 325 MG PO TABS
650.0000 mg | ORAL_TABLET | ORAL | Status: DC | PRN
  Administered 2023-02-24 (×4): 650 mg via ORAL
  Filled 2023-02-23 (×4): qty 2

## 2023-02-23 MED ORDER — SENNOSIDES-DOCUSATE SODIUM 8.6-50 MG PO TABS
2.0000 | ORAL_TABLET | Freq: Every day | ORAL | Status: DC
Start: 2023-02-24 — End: 2023-02-25
  Administered 2023-02-24 – 2023-02-25 (×2): 2 via ORAL
  Filled 2023-02-23 (×2): qty 2

## 2023-02-23 MED ORDER — WITCH HAZEL-GLYCERIN EX PADS
1.0000 | MEDICATED_PAD | CUTANEOUS | Status: DC | PRN
  Administered 2023-02-24: 1 via TOPICAL

## 2023-02-23 MED ORDER — PRENATAL MULTIVITAMIN CH
1.0000 | ORAL_TABLET | Freq: Every day | ORAL | Status: DC
Start: 2023-02-24 — End: 2023-02-25
  Administered 2023-02-24 – 2023-02-25 (×2): 1 via ORAL
  Filled 2023-02-23 (×2): qty 1

## 2023-02-23 MED ORDER — LIDOCAINE HCL (PF) 1 % IJ SOLN
INTRAMUSCULAR | Status: DC | PRN
  Administered 2023-02-23: 11 mL via EPIDURAL

## 2023-02-23 MED ORDER — LACTATED RINGERS IV SOLN
500.0000 mL | Freq: Once | INTRAVENOUS | Status: AC
  Administered 2023-02-23: 500 mL via INTRAVENOUS

## 2023-02-23 MED ORDER — TRANEXAMIC ACID-NACL 1000-0.7 MG/100ML-% IV SOLN
INTRAVENOUS | Status: AC
  Administered 2023-02-23: 1000 mg via INTRAVENOUS
  Filled 2023-02-23: qty 100

## 2023-02-23 MED ORDER — COCONUT OIL OIL: 1.0000 | TOPICAL_OIL | Status: DC | PRN

## 2023-02-23 MED ORDER — ONDANSETRON HCL 4 MG/2ML IJ SOLN: 4.0000 mg | INTRAMUSCULAR | Status: DC | PRN

## 2023-02-23 MED ORDER — ONDANSETRON HCL 4 MG PO TABS: 4.0000 mg | ORAL_TABLET | ORAL | Status: DC | PRN

## 2023-02-23 MED ORDER — TETANUS-DIPHTH-ACELL PERTUSSIS 5-2.5-18.5 LF-MCG/0.5 IM SUSY: 0.5000 mL | PREFILLED_SYRINGE | Freq: Once | INTRAMUSCULAR | Status: DC

## 2023-02-23 MED ORDER — BENZOCAINE-MENTHOL 20-0.5 % EX AERO
1.0000 | INHALATION_SPRAY | CUTANEOUS | Status: DC | PRN
  Administered 2023-02-24: 1 via TOPICAL
  Filled 2023-02-23: qty 56

## 2023-02-23 NOTE — Anesthesia Preprocedure Evaluation (Signed)
Anesthesia Evaluation  Patient identified by MRN, date of birth, ID band Patient awake    Reviewed: Allergy & Precautions, Patient's Chart, lab work & pertinent test results  Airway Mallampati: II  TM Distance: >3 FB Neck ROM: Full    Dental no notable dental hx. (+) Teeth Intact   Pulmonary neg pulmonary ROS   Pulmonary exam normal breath sounds clear to auscultation       Cardiovascular negative cardio ROS Normal cardiovascular exam Rhythm:Regular Rate:Normal     Neuro/Psych negative neurological ROS  negative psych ROS   GI/Hepatic ,GERD  ,,  Endo/Other  negative endocrine ROS    Renal/GU negative Renal ROS  negative genitourinary   Musculoskeletal negative musculoskeletal ROS (+)    Abdominal   Peds  Hematology negative hematology ROS (+)   Anesthesia Other Findings   Reproductive/Obstetrics (+) Pregnancy                              Anesthesia Physical Anesthesia Plan  ASA: II  Anesthesia Plan: Epidural   Post-op Pain Management:    Induction:   PONV Risk Score and Plan:   Airway Management Planned: Natural Airway  Additional Equipment:   Intra-op Plan:   Post-operative Plan:   Informed Consent: I have reviewed the patients History and Physical, chart, labs and discussed the procedure including the risks, benefits and alternatives for the proposed anesthesia with the patient or authorized representative who has indicated his/her understanding and acceptance.       Plan Discussed with: Anesthesiologist  Anesthesia Plan Comments:          Anesthesia Quick Evaluation

## 2023-02-23 NOTE — Anesthesia Procedure Notes (Signed)
Epidural Patient location during procedure: OB Start time: 02/23/2023 5:25 PM End time: 02/23/2023 5:39 PM  Staffing Anesthesiologist: Lowella Curb, MD Performed: anesthesiologist   Preanesthetic Checklist Completed: patient identified, IV checked, site marked, risks and benefits discussed, surgical consent, monitors and equipment checked, pre-op evaluation and timeout performed  Epidural Patient position: sitting Prep: ChloraPrep Patient monitoring: heart rate, cardiac monitor, continuous pulse ox and blood pressure Approach: midline Location: L2-L3 Injection technique: LOR saline  Needle:  Needle type: Tuohy  Needle gauge: 17 G Needle length: 9 cm Needle insertion depth: 5 cm Catheter type: closed end flexible Catheter size: 20 Guage Catheter at skin depth: 9 cm Test dose: negative  Assessment Events: blood not aspirated, injection not painful, no injection resistance, no paresthesia and negative IV test  Additional Notes Reason for block:procedure for pain

## 2023-02-23 NOTE — Progress Notes (Signed)
BP 124/75   Pulse 72   Temp 97.9 F (36.6 C) (Oral)   Resp 16   Ht 5\' 10"  (1.778 m)   Wt 89.9 kg   LMP 06/02/2022   SpO2 100%   BMI 28.45 kg/m  Pt endorses some shaking but denies fevers, no rectal pressure.  CE complete/0 TOCO q76min  No urge to push at this time, cat 1 tracing with mod var. Wants to start pushing at 2030.

## 2023-02-23 NOTE — MAU Provider Note (Signed)
S: Ms. Cynthia Campbell is a 37 y.o. G3P2002 at [redacted]w[redacted]d  who presents to MAU today complaining of leaking of fluid since 1130. She denies vaginal bleeding. She endorses irregular contractions. She reports normal fetal movement.    O: BP 133/76   Pulse 69   Temp 97.7 F (36.5 C) (Oral)   Resp 18   Ht 5\' 10"  (1.778 m)   Wt 89.9 kg   LMP 06/02/2022   SpO2 100%   BMI 28.45 kg/m  GENERAL: Well-developed, well-nourished female in no acute distress.  HEAD: Normocephalic, atraumatic.  CHEST: Normal effort of breathing, regular heart rate ABDOMEN: Soft, nontender, gravid PELVIC: Fern collected by RN was Negative. ROM Plus collected using blind swab technique by RN.  Cervical exam:   1/50/-3/vtx  Fetal Monitoring: Baseline: 130 Variability: moderate Accelerations: present Decelerations: absent Contractions: irregular UC's with UI noted  Results for orders placed or performed during the hospital encounter of 02/23/23 (from the past 24 hour(s))  Fern Test     Status: Normal   Collection Time: 02/23/23  1:45 PM  Result Value Ref Range   POCT Fern Test Negative = intact amniotic membranes   Rupture of Membrane (ROM) Plus     Status: None   Collection Time: 02/23/23  2:09 PM  Result Value Ref Range   Rom Plus POSITIVE    GBS: Negative  A: SIUP at [redacted]w[redacted]d  SROM  P: 1. Amniotic fluid leaking  2. [redacted] weeks gestation of pregnancy - Orders for RN to call on-call MD for [facility] to admit to L&D for SROM   Raelyn Mora, CNM 02/23/2023, 3:00 PM
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
## 2023-02-23 NOTE — Lactation Note (Signed)
This note was copied from a baby's chart. Lactation Consultation Note  Patient Name: Cynthia Campbell [id number] Date: 02/23/2023 Age:35 hours Reason for consult: L&D Initial assessment;Early term 37-38.6wks Experienced BF mom was BF baby when LC came to rm. Mom stated baby is BF well. Mom excited and happy about new baby.  Symptoms of BF reviewed, cramping, sleepiness, and thirst expected.  Will f/u w/mom on MBU.  Maternal Data Does the patient have breastfeeding experience prior to this delivery?: Yes  Feeding    LATCH Score Latch: Grasps breast easily, tongue down, lips flanged, rhythmical sucking.        Comfort (Breast/Nipple): Soft / non-tender  Hold (Positioning): No assistance needed to correctly position infant at breast.      Lactation Tools Discussed/Used    Interventions    Discharge    Consult Status Consult Status: Follow-up from L&D Date: 02/24/23 Follow-up type: In-patient    [name] 02/23/2023, 9:21 PM

## 2023-02-23 NOTE — MAU Note (Signed)
Cynthia Campbell is a 37 y.o. at [redacted]w[redacted]d here in MAU reporting: she thinks she may be leaking fluid since 1130 this morning, reports fluid is clear.  States beginning to feel irregular ctxs.  Denies VB.  Endorses +FM. LMP: NA Onset of complaint: today Pain score: 2 Vitals:   02/23/23 1341  BP: 127/75  Pulse: 73  Resp: 18  Temp: 97.7 F (36.5 C)  SpO2: 100%     FHT:140 bpm Lab orders placed from triage:   None

## 2023-02-24 LAB — CBC
HCT: 34.4 % — ABNORMAL LOW (ref 36.0–46.0)
Hemoglobin: 11.8 g/dL — ABNORMAL LOW (ref 12.0–15.0)
MCH: 31.1 pg (ref 26.0–34.0)
MCHC: 34.3 g/dL (ref 30.0–36.0)
MCV: 90.5 fL (ref 80.0–100.0)
Platelets: 327 10*3/uL (ref 150–400)
RBC: 3.8 MIL/uL — ABNORMAL LOW (ref 3.87–5.11)
RDW: 14 % (ref 11.5–15.5)
WBC: 18 10*3/uL — ABNORMAL HIGH (ref 4.0–10.5)
nRBC: 0 % (ref 0.0–0.2)

## 2023-02-24 LAB — RPR: RPR Ser Ql: NONREACTIVE

## 2023-02-24 MED ORDER — OXYCODONE HCL 5 MG PO TABS
5.0000 mg | ORAL_TABLET | Freq: Four times a day (QID) | ORAL | Status: DC | PRN
  Administered 2023-02-24 (×3): 5 mg via ORAL
  Filled 2023-02-24 (×3): qty 1

## 2023-02-24 MED ORDER — RHO D IMMUNE GLOBULIN 1500 UNIT/2ML IJ SOSY
300.0000 ug | PREFILLED_SYRINGE | Freq: Once | INTRAMUSCULAR | Status: AC
Start: 2023-02-24 — End: 2023-02-24
  Administered 2023-02-24: 300 ug via INTRAVENOUS
  Filled 2023-02-24: qty 2

## 2023-02-24 NOTE — Anesthesia Postprocedure Evaluation (Signed)
Anesthesia Post Note  Patient: Cynthia Campbell  Procedure(s) Performed: AN AD HOC LABOR EPIDURAL     Patient location during evaluation: Mother Baby Anesthesia Type: Epidural Level of consciousness: awake, oriented and awake and alert Pain management: pain level controlled Vital Signs Assessment: post-procedure vital signs reviewed and stable Respiratory status: spontaneous breathing, respiratory function stable and nonlabored ventilation Cardiovascular status: stable Postop Assessment: no headache, adequate PO intake, able to ambulate, patient able to bend at knees and no apparent nausea or vomiting Anesthetic complications: no   No notable events documented.  Last Vitals:  Vitals:   02/24/23 0358 02/24/23 0735  BP: (!) 120/54 (!) 113/56  Pulse: 63 60  Resp: 14 18  Temp: 36.6 C   SpO2: 97% 98%    Last Pain:  Vitals:   02/24/23 0735  TempSrc: Oral  PainSc:    Pain Goal:                   Wynne Jury
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
## 2023-02-24 NOTE — Progress Notes (Signed)
PPD #1 No problems Afeb, VSS Fundus firm, NT at U-1 Continue routine postpartum care, boy for circ tomorrow, peds has not seen yet

## 2023-02-24 NOTE — Lactation Note (Signed)
This note was copied from a baby's chart. Lactation Consultation Note  Patient Name: Cynthia Campbell [id number] Date: 02/24/2023 Age:35 hours Reason for consult: Initial assessment  P3, 38 wks, 12 hrs of life. Mom bringing baby to breast with LC arrival, latches well. Discussed baby readiness at breast through first days, from sleepy today to cluster feeding overnights to bring in milk supply. Experienced breast feeder, highlighted more hands on with newborn- initial hand expression and breast compression as baby learns at breast. Highlighted feeding /diapering expectations, feeding baby every 3 hours, using hand expression onto spoon and feeding off spoon if too tired or irritable to start at breast. Encouraged equal breast stimulation, application of EBM post feed. Highlighted LC O/P, support group, and mommy & me services availability after D/C, and milk storage times of pumped milk.     Maternal Data Does the patient have breastfeeding experience prior to this delivery?: Yes How long did the patient breastfeed?: over a year with each baby  Feeding Mother's Current Feeding Choice: Breast Milk  LATCH Score Latch: Grasps breast easily, tongue down, lips flanged, rhythmical sucking.  Audible Swallowing: Spontaneous and intermittent  Type of Nipple: Everted at rest and after stimulation  Comfort (Breast/Nipple): Soft / non-tender  Hold (Positioning): No assistance needed to correctly position infant at breast.  LATCH Score: 10   Lactation Tools Discussed/Used    Interventions Interventions: Breast feeding basics reviewed;Hand express;Breast compression;Hand pump;Education;LC Services brochure (Milk storage guidelines)  Discharge Pump: Personal;Hands Free;Manual (Hand pump to mom per her request. Spectra pump @ home, encouraged mom to make sure pump softens breast as milk is emptied. Watch for engorgement, overfiling of milk from inadequate pump action/strength of a wearable  pump.)  Consult Status Consult Status: Follow-up Date: 02/25/23 Follow-up type: In-patient    [facility] 02/24/2023, 8:43 AM

## 2023-02-24 NOTE — Lactation Note (Signed)
This note was copied from a baby's chart. Lactation Consultation Note  Patient Name: Cynthia Campbell [id number] Date: 02/24/2023 Age:35 hours  Attempted to see mom but she was sleeping.    Maternal Data    Feeding    LATCH Score                    Lactation Tools Discussed/Used    Interventions    Discharge    Consult Status      [name] 02/24/2023, 5:14 AM

## 2023-02-25 LAB — RH IG WORKUP (INCLUDES ABO/RH)
Fetal Screen: NEGATIVE
Gestational Age(Wks): 38
Unit division: 0

## 2023-02-25 MED ORDER — ACETAMINOPHEN 325 MG PO TABS: 650.0000 mg | ORAL_TABLET | ORAL | Status: DC | PRN

## 2023-02-25 MED ORDER — IBUPROFEN 600 MG PO TABS: 600.0000 mg | ORAL_TABLET | Freq: Four times a day (QID) | ORAL | Status: DC

## 2023-02-25 NOTE — Lactation Note (Signed)
This note was copied from a baby's chart. Lactation Consultation Note  Patient Name: Cynthia Campbell [id number] Date: 02/25/2023 Age:35 hours Reason for consult: Follow-up assessment;Early term 37-38.6wks  P3, 39 wks. Mom anticipates discharge today. Highlighted expectations of infant feeding pattern, cluster feeding overnight to bring milk in, and milk coming in over the next days. Mom has an electric pump at home- Spectra. Encouraged mom that breast stimulation is key for milk production, highlighted the differences of bottle feeding and breast feeding. Encouraged hand expression and breast compression to keep infants attention at breast. Discussed prevention and treatment of engorgement. Highlighted pro-active breast care, coconut oil provided to mom. Re-enforced LC resources O/P available to mom, encouraged to call as desired for [facility] assist prior to DC.  Maternal Data Does the patient have breastfeeding experience prior to this delivery?: Yes  Feeding Mother's Current Feeding Choice: Breast Milk   Interventions Interventions: Hand express;Breast compression;Coconut oil;Expressed milk;Education;LC Services brochure;CDC Guidelines for Breast Pump Cleaning (Milk storage guidelines)  Discharge Discharge Education: Engorgement and breast care Pump: DEBP;Personal (Spectra)  Consult Status Consult Status: Complete Date: 02/25/23    [name] 02/25/2023, 10:46 AM
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
## 2023-02-25 NOTE — Progress Notes (Signed)
Post Partum Day 2 Subjective: no complaints, up ad lib, voiding, tolerating PO, and + flatus  Objective:    02/25/2023    5:44 AM 02/24/2023   10:53 PM 02/24/2023   12:35 PM  Vitals with BMI  Systolic 125 117 161  Diastolic 80 66 72  Pulse 60 62 58     Physical Exam:  General: alert, cooperative, and no distress Lochia: appropriate Uterine Fundus: firm DVT Evaluation: No evidence of DVT seen on physical exam.  Recent Labs    02/23/23 1516 02/24/23 0441  HGB 14.5 11.8*  HCT 42.9 34.4*    Assessment/Plan: Cynthia Campbell is doing well this morning. She would like discharge home after baby's circumcision. -Rh neg: baby Rh pos, now s/p Rhogam this hospitalization.  Circumcision prior to discharge   Desires neonatal circumcision, R/B/A of procedure discussed at length. Pt understands that neonatal circumcision is not considered medically necessary and is elective. The risks include, but are not limited to bleeding, infection, damage to the penis, development of scar tissue, and having to have it redone at a later date. Pt understands theses risks and wishes to proceed.    LOS: 2 days   Willa Frater, MD 02/25/2023, 7:51 AM

## 2023-02-27 DIAGNOSIS — N898 Other specified noninflammatory disorders of vagina: Secondary | ICD-10-CM | POA: Diagnosis not present

## 2023-02-27 DIAGNOSIS — N760 Acute vaginitis: Secondary | ICD-10-CM | POA: Diagnosis not present

## 2023-03-04 ENCOUNTER — Telehealth (HOSPITAL_COMMUNITY): Payer: Self-pay

## 2023-03-04 NOTE — Telephone Encounter (Signed)
03/04/2023 1739  Name: DORANNE VANSCHOYCK MRN: [id number] DOB: 1985/08/18  Reason for Call:  Transition of Care Hospital Discharge Call  Contact Status: Patient Contact Status: Message  Language assistant needed:          Follow-Up Questions:    Inocente Salles Postnatal Depression Scale:  In the Past 7 Days:    PHQ2-9 Depression Scale:     Discharge Follow-up:    Post-discharge interventions: NA  Signature  Signe Colt

## 2023-03-16 ENCOUNTER — Encounter (HOSPITAL_COMMUNITY): Payer: 59

## 2023-04-03 DIAGNOSIS — Z1331 Encounter for screening for depression: Secondary | ICD-10-CM | POA: Diagnosis not present

## 2023-04-09 DIAGNOSIS — D2221 Melanocytic nevi of right ear and external auricular canal: Secondary | ICD-10-CM | POA: Diagnosis not present

## 2023-04-09 DIAGNOSIS — D2272 Melanocytic nevi of left lower limb, including hip: Secondary | ICD-10-CM | POA: Diagnosis not present

## 2023-04-09 DIAGNOSIS — L905 Scar conditions and fibrosis of skin: Secondary | ICD-10-CM | POA: Diagnosis not present

## 2023-04-09 DIAGNOSIS — D2261 Melanocytic nevi of right upper limb, including shoulder: Secondary | ICD-10-CM | POA: Diagnosis not present

## 2023-04-09 DIAGNOSIS — D2271 Melanocytic nevi of right lower limb, including hip: Secondary | ICD-10-CM | POA: Diagnosis not present

## 2023-04-09 DIAGNOSIS — D1801 Hemangioma of skin and subcutaneous tissue: Secondary | ICD-10-CM | POA: Diagnosis not present

## 2023-04-09 DIAGNOSIS — D225 Melanocytic nevi of trunk: Secondary | ICD-10-CM | POA: Diagnosis not present

## 2023-07-17 ENCOUNTER — Telehealth: Admitting: Physician Assistant

## 2023-07-17 DIAGNOSIS — B9689 Other specified bacterial agents as the cause of diseases classified elsewhere: Secondary | ICD-10-CM

## 2023-07-17 DIAGNOSIS — J019 Acute sinusitis, unspecified: Secondary | ICD-10-CM

## 2023-07-17 MED ORDER — AMOXICILLIN-POT CLAVULANATE 875-125 MG PO TABS: 1.0000 | ORAL_TABLET | Freq: Two times a day (BID) | ORAL | 0 refills | Status: DC

## 2023-07-17 NOTE — Progress Notes (Signed)

## 2023-07-17 NOTE — Progress Notes (Signed)
 I have spent 5 minutes in review of e-visit questionnaire, review and updating patient chart, medical decision making and response to patient.   Piedad Climes, PA-C

## 2023-11-28 ENCOUNTER — Telehealth: Admitting: Family Medicine

## 2023-11-28 DIAGNOSIS — R051 Acute cough: Secondary | ICD-10-CM | POA: Diagnosis not present

## 2023-11-28 DIAGNOSIS — J069 Acute upper respiratory infection, unspecified: Secondary | ICD-10-CM

## 2023-11-28 MED ORDER — AZITHROMYCIN 250 MG PO TABS: ORAL_TABLET | ORAL | 0 refills | Status: AC

## 2023-11-28 MED ORDER — PREDNISONE 20 MG PO TABS: 20.0000 mg | ORAL_TABLET | Freq: Two times a day (BID) | ORAL | 0 refills | Status: AC

## 2023-11-28 NOTE — Progress Notes (Signed)
 E-Visit for Cough   We are sorry that you are not feeling well.  Here is how we plan to help!  Based on your presentation I believe you most likely have A cough due to bacteria.  When patients have a fever and a productive cough with a change in color or increased sputum production, we are concerned about bacterial bronchitis.  If left untreated it can progress to pneumonia.  If your symptoms do not improve with your treatment plan it is important that you contact your provider.   I have prescribed Azithromyin 250 mg: two tablets now and then one tablet daily for 4 additonal days    In addition you may use prednisone   From your responses in the eVisit questionnaire you describe inflammation in the upper respiratory tract which is causing a significant cough.  This is commonly called Bronchitis and has four common causes:   Allergies Viral Infections Acid Reflux Bacterial Infection Allergies, viruses and acid reflux are treated by controlling symptoms or eliminating the cause. An example might be a cough caused by taking certain blood pressure medications. You stop the cough by changing the medication. Another example might be a cough caused by acid reflux. Controlling the reflux helps control the cough.  USE OF BRONCHODILATOR (RESCUE) INHALERS: There is a risk from using your bronchodilator too frequently.  The risk is that over-reliance on a medication which only relaxes the muscles surrounding the breathing tubes can reduce the effectiveness of medications prescribed to reduce swelling and congestion of the tubes themselves.  Although you feel brief relief from the bronchodilator inhaler, your asthma may actually be worsening with the tubes becoming more swollen and filled with mucus.  This can delay other crucial treatments, such as oral steroid medications. If you need to use a bronchodilator inhaler daily, several times per day, you should discuss this with your provider.  There are probably  better treatments that could be used to keep your asthma under control.     HOME CARE Only take medications as instructed by your medical team. Complete the entire course of an antibiotic. Drink plenty of fluids and get plenty of rest. Avoid close contacts especially the very young and the elderly Cover your mouth if you cough or cough into your sleeve. Always remember to wash your hands A steam or ultrasonic humidifier can help congestion.   GET HELP RIGHT AWAY IF: You develop worsening fever. You become short of breath You cough up blood. Your symptoms persist after you have completed your treatment plan MAKE SURE YOU  Understand these instructions. Will watch your condition. Will get help right away if you are not doing well or get worse.    Thank you for choosing an e-visit.  Your e-visit answers were reviewed by a board certified advanced clinical practitioner to complete your personal care plan. Depending upon the condition, your plan could have included both over the counter or prescription medications.  Please review your pharmacy choice. Make sure the pharmacy is open so you can pick up prescription now. If there is a problem, you may contact your provider through [employer] and have the prescription routed to another pharmacy.  Your safety is important to us . If you have drug allergies check your prescription carefully.   For the next 24 hours you can use MyChart to ask questions about today's visit, request a non-urgent call back, or ask for a work or school excuse. You will get an email in the next two days  asking about your experience. I hope that your e-visit has been valuable and will speed your recovery.    have provided 5 minutes of non face to face time during this encounter for chart review and documentation.

## 2023-12-17 ENCOUNTER — Other Ambulatory Visit: Payer: Self-pay | Admitting: Gastroenterology

## 2023-12-17 DIAGNOSIS — R1011 Right upper quadrant pain: Secondary | ICD-10-CM

## 2024-01-20 ENCOUNTER — Other Ambulatory Visit

## 2024-01-23 ENCOUNTER — Other Ambulatory Visit

## 2024-01-23 ENCOUNTER — Ambulatory Visit
Admission: RE | Admit: 2024-01-23 | Discharge: 2024-01-23 | Disposition: A | Discharge status: Home / Self Care | Source: Ambulatory Visit | Attending: Gastroenterology | Admitting: Gastroenterology

## 2024-01-23 DIAGNOSIS — R1011 Right upper quadrant pain: Secondary | ICD-10-CM

## 2024-02-04 ENCOUNTER — Telehealth: Admitting: Physician Assistant

## 2024-02-04 DIAGNOSIS — R3989 Other symptoms and signs involving the genitourinary system: Secondary | ICD-10-CM

## 2024-02-04 MED ORDER — CEPHALEXIN 500 MG PO CAPS: 500.0000 mg | ORAL_CAPSULE | Freq: Two times a day (BID) | ORAL | 0 refills | Status: AC

## 2024-02-04 NOTE — Progress Notes (Signed)

## 2024-06-09 ENCOUNTER — Ambulatory Visit: Admitting: Physical Therapy
